# Patient Record
Sex: Female | Born: 1935 | Race: Black or African American | Hispanic: No | Smoking: Never smoker
Health system: Southern US, Community
[De-identification: ages and names within clinical notes are randomized; demographics above are authoritative.]

## PROBLEM LIST (undated history)

## (undated) DIAGNOSIS — I10 Essential (primary) hypertension: Secondary | ICD-10-CM

## (undated) DIAGNOSIS — K219 Gastro-esophageal reflux disease without esophagitis: Secondary | ICD-10-CM

## (undated) DIAGNOSIS — M1990 Unspecified osteoarthritis, unspecified site: Secondary | ICD-10-CM

## (undated) DIAGNOSIS — Z950 Presence of cardiac pacemaker: Secondary | ICD-10-CM

## (undated) HISTORY — PX: ABDOMINAL HYSTERECTOMY: SHX81

---

## 2021-08-19 ENCOUNTER — Ambulatory Visit (INDEPENDENT_AMBULATORY_CARE_PROVIDER_SITE_OTHER): Payer: Medicare Other

## 2021-08-19 ENCOUNTER — Ambulatory Visit (INDEPENDENT_AMBULATORY_CARE_PROVIDER_SITE_OTHER): Payer: Medicare Other | Admitting: Orthopaedic Surgery

## 2021-08-19 DIAGNOSIS — M1611 Unilateral primary osteoarthritis, right hip: Secondary | ICD-10-CM | POA: Diagnosis not present

## 2021-08-19 DIAGNOSIS — M25551 Pain in right hip: Secondary | ICD-10-CM

## 2021-08-19 NOTE — Progress Notes (Signed)
Office Visit Note   Patient: Audrey Gomez           Date of Birth: [birth year]           MRN: [id number] Visit Date: 08/19/2021              Requested by: No referring provider defined for this encounter. PCP: No primary care provider on file.   Assessment & Plan: Visit Diagnoses:  1. Pain in right hip   2. Unilateral primary osteoarthritis, right hip     Plan: I spoke in length in detail about hip replacement surgery.  I went over her x-rays and gave her handout about hip replacement surgery.  I showed her hip model and described in detail what the surgery involves.  We talked about the risk and benefits of surgery and what to expect from an intraoperative and postoperative course.  All questions and concerns were answered and addressed.  Due to the detrimental effect this is having on her quality of life I agree with proceeding with the surgery.  We will work on getting this scheduled.  We will be in touch.  Follow-Up Instructions: Return for 2 weeks post-op.   Orders:  Orders Placed This Encounter  Procedures   XR HIP UNILAT W OR W/O PELVIS 1V RIGHT   No orders of the defined types were placed in this encounter.     Procedures: No procedures performed   Clinical Data: No additional findings.   Subjective: Chief Complaint  Patient presents with   Right Hip - Pain  The patient is an 87 year old female who comes down from [place] to evaluate and treat known severe end-stage arthritis of her right hip.  She is already seen her cardiologist who has cleared her for surgery.  She is not on blood thinning medication.  I believe she does have a pacemaker though.  Notes in care everywhere show that she has been cleared for surgery.  Her daughter is with her.  She reports severe 10 out of 10 right hip pain has been getting worse for over a year now.  She does ambulate with a cane.  She has pain in her groin and it radiates down to her right knee.  She is not a  diabetic either.  She has tried and failed all forms conservative treatment including activity modification and anti-inflammatories and pain medications.  HPI  Review of Systems There is no listed headache, chest pain, shortness of breath, fever, chills, nausea, vomiting  Objective: Vital Signs: There were no vitals taken for this visit.  Physical Exam She is alert and orient x3 and in no acute distress Ortho Exam Examination of her right hip shows severe pain with internal and external rotation and stiffness with rotation.  Her left hip exam is normal. Specialty Comments:  No specialty comments available.  Imaging: XR HIP UNILAT W OR W/O PELVIS 1V RIGHT  Result Date: 08/19/2021 An AP pelvis and lateral of the right hip shows severe end-stage arthritis of the right hip.  There is essentially no joint space remaining.  There is flattening of the femoral head and cystic changes in the femoral head and acetabulum with periarticular osteophytes.    PMFS History: There are no problems to display for this patient.  No past medical history on file.  No family history on file.   Social History   Occupational History   Not on file  Tobacco Use   Smoking status: Not on file  Smokeless tobacco: Not on file  Substance and Sexual Activity   Alcohol use: Not on file   Drug use: Not on file   Sexual activity: Not on file

## 2021-09-26 ENCOUNTER — Telehealth: Payer: Self-pay | Admitting: Orthopaedic Surgery

## 2021-09-26 NOTE — Telephone Encounter (Signed)
Pt calling before surg to verify she does not need a 2 week pre op appt. She heard somewhere along the lines she needed a two week pre op appt as well and if she did not attend that, she would not be able to have surg on 11/29. She is currently sch'd for surg on 11/29 with 2 week post op follow up on 12/12. The best call back numbers are [phone] or [phone].

## 2021-10-01 NOTE — Telephone Encounter (Signed)
I called patient and confirmed only needs one pre-op before surgery.  It is scheduled for 10-08-21.

## 2021-10-02 ENCOUNTER — Other Ambulatory Visit: Payer: Self-pay | Admitting: Physician Assistant

## 2021-10-02 DIAGNOSIS — M1611 Unilateral primary osteoarthritis, right hip: Secondary | ICD-10-CM

## 2021-10-07 NOTE — Progress Notes (Signed)
PERIOPERATIVE PRESCRIPTION FOR IMPLANTED CARDIAC DEVICE PROGRAMMING paper was faxed to [facility] on 10/07/2021.

## 2021-10-07 NOTE — Progress Notes (Signed)
Surgical Instructions    Your procedure is scheduled on Tuesday, November 29th, 2022.   Report to [facility] Main Entrance "A" at 08:00 A.M., then check in with the Admitting office.  Call this number if you have problems the morning of surgery:  [phone]   If you have any questions prior to your surgery date call [phone]: Open Monday-Friday 8am-4pm    Remember:  Do not eat after midnight the night before your surgery  You may drink clear liquids until 07:00 the morning of your surgery.   Clear liquids allowed are: Water, Non-Citrus Juices (without pulp), Carbonated Beverages, Clear Tea, Black Coffee ONLY (NO MILK, CREAM OR POWDERED CREAMER of any kind), and Gatorade  Patient Instructions  The night before surgery:  No food after midnight. ONLY clear liquids after midnight  The day of surgery (if you do NOT have diabetes):   Drink ONE (1) Pre-Surgery Clear Ensure by 07:00 the morning of surgery. Drink in one sitting. Do not sip.  This drink was given to you during your hospital  pre-op appointment visit.  Nothing else to drink after completing the  Pre-Surgery Clear Ensure.          If you have questions, please contact your surgeon's office.     Take these medicines the morning of surgery with A SIP OF WATER:  BYSTOLIC  gabapentin (NEURONTIN)  traMADol (ULTRAM)   Follow your surgeon's instructions on when to stop Aspirin.  If no instructions were given by your surgeon then you will need to call the office to get those instructions.     As of today, STOP taking any Aspirin (unless otherwise instructed by your surgeon) Aleve, Naproxen, Ibuprofen, Motrin, Advil, Goody's, BC's, all herbal medications, fish oil, and all vitamins.   After your COVID test   You are not required to quarantine however you are required to wear a well-fitting mask when you are out and around people not in your household.  If your mask becomes wet or soiled, replace with a new  one.  Wash your hands often with soap and water for 20 seconds or clean your hands with an alcohol-based hand sanitizer that contains at least 60% alcohol.  Do not share personal items.  Notify your provider: if you are in close contact with someone who has COVID  or if you develop a fever of 100.4 or greater, sneezing, cough, sore throat, shortness of breath or body aches.    The day of surgery:           Do not wear jewelry or makeup Do not wear lotions, powders, perfumes, or deodorant. Do not shave 48 hours prior to surgery.   Do not bring valuables to the hospital. DO Not wear nail polish, gel polish, artificial nails, or any other type of covering on natural nails including finger and toenails. If patients have artificial nails, gel coating, etc. that need to be removed by a nail salon, please have this removed prior to surgery or surgery may need to be canceled/delayed if the surgeon/ anesthesia feels like the patient is unable to be adequately monitored.              [facility] is not responsible for any belongings or valuables.  Do NOT Smoke (Tobacco/Vaping)  24 hours prior to your procedure  If you use a CPAP at night, you may bring your mask for your overnight stay.   Contacts, glasses, hearing aids, dentures or partials may not be worn  into surgery, please bring cases for these belongings   For patients admitted to the hospital, discharge time will be determined by your treatment team.   Patients discharged the day of surgery will not be allowed to drive home, and someone needs to stay with them for 24 hours.  NO VISITORS WILL BE ALLOWED IN PRE-OP WHERE PATIENTS ARE PREPPED FOR SURGERY.  ONLY 1 SUPPORT PERSON MAY BE PRESENT IN THE WAITING ROOM WHILE YOU ARE IN SURGERY.  IF YOU ARE TO BE ADMITTED, ONCE YOU ARE IN YOUR ROOM YOU WILL BE ALLOWED TWO (2) VISITORS. 1 (ONE) VISITOR MAY STAY OVERNIGHT BUT MUST ARRIVE TO THE ROOM BY 8pm.  Minor children may have two parents  present. Special consideration for safety and communication needs will be reviewed on a case by case basis.  Special instructions:    Oral Hygiene is also important to reduce your risk of infection.  Remember - BRUSH YOUR TEETH THE MORNING OF SURGERY WITH YOUR REGULAR TOOTHPASTE   [facility]- Preparing For Surgery  Before surgery, you can play an important role. Because skin is not sterile, your skin needs to be as free of germs as possible. You can reduce the number of germs on your skin by washing with CHG (chlorahexidine gluconate) Soap before surgery.  CHG is an antiseptic cleaner which kills germs and bonds with the skin to continue killing germs even after washing.     Please do not use if you have an allergy to CHG or antibacterial soaps. If your skin becomes reddened/irritated stop using the CHG.  Do not shave (including legs and underarms) for at least 48 hours prior to first CHG shower. It is OK to shave your face.  Please follow these instructions carefully.     Shower the NIGHT BEFORE SURGERY and the MORNING OF SURGERY with CHG Soap.   If you chose to wash your hair, wash your hair first as usual with your normal shampoo. After you shampoo, rinse your hair and body thoroughly to remove the shampoo.  Then [employer] and genitals (private parts) with your normal soap and rinse thoroughly to remove soap.  After that Use CHG Soap as you would any other liquid soap. You can apply CHG directly to the skin and wash gently with a scrungie or a clean washcloth.   Apply the CHG Soap to your body ONLY FROM THE NECK DOWN.  Do not use on open wounds or open sores. Avoid contact with your eyes, ears, mouth and genitals (private parts). Wash Face and genitals (private parts)  with your normal soap.   Wash thoroughly, paying special attention to the area where your surgery will be performed.  Thoroughly rinse your body with warm water from the neck down.  DO NOT shower/wash with your normal  soap after using and rinsing off the CHG Soap.  Pat yourself dry with a CLEAN TOWEL.  Wear CLEAN PAJAMAS to bed the night before surgery  Place CLEAN SHEETS on your bed the night before your surgery  DO NOT SLEEP WITH PETS.   Day of Surgery:  Take a shower with CHG soap. Wear Clean/Comfortable clothing the morning of surgery Do not apply any deodorants/lotions.   Remember to brush your teeth WITH YOUR REGULAR TOOTHPASTE.   Please read over the following fact sheets that you were given.

## 2021-10-08 ENCOUNTER — Other Ambulatory Visit: Payer: Self-pay

## 2021-10-08 ENCOUNTER — Encounter (HOSPITAL_COMMUNITY): Payer: Self-pay

## 2021-10-08 ENCOUNTER — Encounter (HOSPITAL_COMMUNITY)
Admission: RE | Admit: 2021-10-08 | Discharge: 2021-10-08 | Disposition: A | Discharge status: Home / Self Care | Payer: Medicare Other | Source: Ambulatory Visit | Attending: Orthopaedic Surgery | Admitting: Orthopaedic Surgery

## 2021-10-08 VITALS — BP 131/82 | HR 63 | Temp 98.4°F | Resp 18 | Ht 71.0 in

## 2021-10-08 DIAGNOSIS — I272 Pulmonary hypertension, unspecified: Secondary | ICD-10-CM | POA: Insufficient documentation

## 2021-10-08 DIAGNOSIS — I071 Rheumatic tricuspid insufficiency: Secondary | ICD-10-CM | POA: Diagnosis not present

## 2021-10-08 DIAGNOSIS — Z01818 Encounter for other preprocedural examination: Secondary | ICD-10-CM | POA: Insufficient documentation

## 2021-10-08 DIAGNOSIS — M1611 Unilateral primary osteoarthritis, right hip: Secondary | ICD-10-CM | POA: Insufficient documentation

## 2021-10-08 DIAGNOSIS — I10 Essential (primary) hypertension: Secondary | ICD-10-CM | POA: Diagnosis not present

## 2021-10-08 DIAGNOSIS — I2510 Atherosclerotic heart disease of native coronary artery without angina pectoris: Secondary | ICD-10-CM | POA: Insufficient documentation

## 2021-10-08 DIAGNOSIS — E785 Hyperlipidemia, unspecified: Secondary | ICD-10-CM | POA: Insufficient documentation

## 2021-10-08 HISTORY — DX: Presence of cardiac pacemaker: Z95.0

## 2021-10-08 HISTORY — DX: Essential (primary) hypertension: I10

## 2021-10-08 HISTORY — DX: Gastro-esophageal reflux disease without esophagitis: K21.9

## 2021-10-08 HISTORY — DX: Unspecified osteoarthritis, unspecified site: M19.90

## 2021-10-08 LAB — BASIC METABOLIC PANEL
Anion gap: 5 (ref 5–15)
BUN: 13 mg/dL (ref 8–23)
CO2: 26 mmol/L (ref 22–32)
Calcium: 9 mg/dL (ref 8.9–10.3)
Chloride: 104 mmol/L (ref 98–111)
Creatinine, Ser: 0.68 mg/dL (ref 0.44–1.00)
GFR, Estimated: >60 mL/min (ref >60)
Glucose, Bld: 82 mg/dL (ref 70–99)
Potassium: 4.6 mmol/L (ref 3.5–5.1)
Sodium: 135 mmol/L (ref 135–145)

## 2021-10-08 LAB — CBC
HCT: 44.5 % (ref 36.0–46.0)
Hemoglobin: 14 g/dL (ref 12.0–15.0)
MCH: 30 pg (ref 26.0–34.0)
MCHC: 31.5 g/dL (ref 30.0–36.0)
MCV: 95.5 fL (ref 80.0–100.0)
Platelets: 217 10*3/uL (ref 150–400)
RBC: 4.66 MIL/uL (ref 3.87–5.11)
RDW: 14 % (ref 11.5–15.5)
WBC: 7.6 10*3/uL (ref 4.0–10.5)
nRBC: 0 % (ref 0.0–0.2)

## 2021-10-08 LAB — SURGICAL PCR SCREEN
MRSA, PCR: POSITIVE — AB
Staphylococcus aureus: POSITIVE — AB

## 2021-10-08 LAB — TYPE AND SCREEN
ABO/RH(D): O POS
Antibody Screen: NEGATIVE

## 2021-10-08 NOTE — Progress Notes (Signed)
PCP: Tamala Fothergill, MD Cardiologist: Derry Lory, MD  EKG: 10/08/21 CXR: 04/10/21 ECHO: 12/21/17 Stress Test: 04/24/14 Cardiac Cath: 02/18/21  Fasting Blood Sugar-na Checks Blood Sugar__na_ times a day  ASA: Patient to call office for instructions Blood Thinner: No  OSA/CPAP: No  Covid test to be done DOS.  Patient lives 1.5 hours away in [place]  Anesthesia Review: Yes, cardiac history.  Reviewed with Fayrene Fearing, [place].  Do not need to request any records.  Clearance in note per Fayrene Fearing.  PPM [employer].  Fax and email sent.  Patient denies shortness of breath, fever, cough, and chest pain at PAT appointment.  Patient verbalized understanding of instructions provided today at the PAT appointment.  Patient asked to review instructions at home and day of surgery.

## 2021-10-08 NOTE — Progress Notes (Signed)
Received PCR result from A. Browning in [place]. Pt positive for MRSA and Staph. Pt will be notified and treated day of surgery.

## 2021-10-08 NOTE — Progress Notes (Incomplete Revision)
PCP: Tamala Fothergill, MD Cardiologist: Derry Lory, MD  EKG: 10/08/21 CXR: 04/10/21 ECHO: 12/21/17 Stress Test: 04/24/14 Cardiac Cath: 02/18/21  Fasting Blood Sugar-na Checks Blood Sugar__na_ times a day  ASA: Patient to call office for instructions Blood Thinner: No  OSA/CPAP: No  Covid test to be done DOS.  Patient lives 1.5 hours away in [place]  Anesthesia Review: Yes, cardiac history.  Reviewed with Fayrene Fearing, [place].  Do not need to request any records.  Clearance in note per Fayrene Fearing.  PPM [employer].  Fax sent and spoke with Kerry Fort, [employer] rep.  He is aware of surgery.  Patient denies shortness of breath, fever, cough, and chest pain at PAT appointment.  Patient verbalized understanding of instructions provided today at the PAT appointment.  Patient asked to review instructions at home and day of surgery.

## 2021-10-09 NOTE — Progress Notes (Addendum)
Anesthesia Chart Review:  Follows with cardiologist Dr. Robby Sermon at [facility] for hx of HTN, HLD, mild TR, mild pulm HTN, nonobstructive CAD, sinus node dysfunction s/p St[employer] Jude PPM. Last seen 08/01/21 for preop eval. Per note, "From the C-V standpoint, based on the [facility] guidelines, the patient is considered to be at low to intermediate risk and it is OK to proceed with her surgery as scheduled. Please avoid hypoxia and hypotension during her procedure."  Perioperative cardiac device instruction form has been faxed to [facility] by PAT RN.  Preop labs reviewed, unremarkable.  EKG 10/08/21: Atrial pace rhythm. Rate 62.  Chest 2 View 04/10/21 (care everywhere):  Comparison is to 03/30/2021. There is a chronically elevated right hemidiaphragm. The cardiac size, mediastinum and pulmonary vascularity are within normal limits. There is a tortuous aorta. Dual-lead pacemaker is unchanged. No obvious rib fracture or pneumothorax. There are marked degenerative changes in both shoulder joints with joint space narrowing.     Cath 02/18/21 (care everywhere): IMPRESSION:  1. Coronaries: 10 to 40% coronary artery disease as noted above.  2. Left ventricle: No wall motion abnormalities, EF 60%  3. Normal LVEDP  4. No gradient across aortic valve   TTE 11/15/21 (care everywhere): CONCLUSIONS    * Left ventricular systolic function is hyperdynamic with an ejection  fraction of 70 % by Simpson's biplane.    * Left ventricular chamber size is normal.    * Left ventricular segmental wall motion is normal.    * There is mildly increased left ventricular wall thickness.    * Left ventricular diastolic function: Grade I diastolic dysfunction.    * Right ventricular chamber dimension is mildly enlarged.    * Right ventricular systolic function is normal with TAPSE measuring 2.62  cm.    * Left atrial chamber is mildly enlarged with a left atrial volume index of  34.70 ml/m^2 by BP MOD.    * Trileaflet aortic valve is  structurally and functionally normal by  two-dimensional, color flow Doppler and Doppler interrogation.    * There is trace mitral valve regurgitation.    * There is mild tricuspid valve regurgitation.    * Moderate pulmonary hypertension, estimated pulmonary arterial systolic  pressure is 54 mmHg.    * There is trace pulmonic regurgitation.    * Device lead was seen in the right atrium and right ventricle.     Zannie Cove [facility] Short Stay Center/Anesthesiology Phone [phone] 10/09/2021 10:55 AM

## 2021-10-09 NOTE — Anesthesia Preprocedure Evaluation (Addendum)
Anesthesia Evaluation  Patient identified by MRN, date of birth, ID band Patient awake    Reviewed: Allergy & Precautions, NPO status , Patient's Chart, lab work & pertinent test results, reviewed documented beta blocker date and time   History of Anesthesia Complications Negative for: history of anesthetic complications  Airway Mallampati: II  TM Distance: >3 FB Neck ROM: Full    Dental  (+) Edentulous Upper, Missing, Dental Advisory Given   Pulmonary neg pulmonary ROS,    Pulmonary exam normal        Cardiovascular hypertension, Pt. on home beta blockers Normal cardiovascular exam+ pacemaker      Neuro/Psych negative neurological ROS     GI/Hepatic Neg liver ROS, GERD  ,  Endo/Other  negative endocrine ROS  Renal/GU negative Renal ROS     Musculoskeletal negative musculoskeletal ROS (+)   Abdominal   Peds  Hematology negative hematology ROS (+)   Anesthesia Other Findings   Reproductive/Obstetrics                           Anesthesia Physical Anesthesia Plan  ASA: 3  Anesthesia Plan: Spinal   Post-op Pain Management: Tylenol PO (pre-op)   Induction:   PONV Risk Score and Plan: 2  Airway Management Planned: Natural Airway  Additional Equipment:   Intra-op Plan:   Post-operative Plan:   Informed Consent: I have reviewed the patients History and Physical, chart, labs and discussed the procedure including the risks, benefits and alternatives for the proposed anesthesia with the patient or authorized representative who has indicated his/her understanding and acceptance.     Dental advisory given  Plan Discussed with: Anesthesiologist and CRNA  Anesthesia Plan Comments: (PAT note by Karoline Caldwell, PA-C: Follows with cardiologist Dr. Mayer Masker at [facility] for hx of HTN, HLD, mild TR, mild pulm HTN, nonobstructive CAD, sinus node dysfunction s/p [employer] Jude PPM. Last seen 08/01/21  for preop eval. Per note, "From the C-V standpoint, based on the [facility] guidelines, the patient is considered to be at low to intermediate risk and it is OK to proceed with her surgery as scheduled. Please avoid hypoxia and hypotension during her procedure."  Perioperative cardiac device instruction form has been faxed to [facility] by PAT RN.  Preop labs reviewed, unremarkable.  EKG 10/08/21: Atrial pace rhythm. Rate 62.  Chest 2 View 04/10/21 (care everywhere): Comparison is to 03/30/2021. There is a chronically elevated right hemidiaphragm. The cardiac size, mediastinum and pulmonary vascularity are within normal limits. There is a tortuous aorta. Dual-lead pacemaker is unchanged. No obvious rib fracture or pneumothorax. There are marked degenerative changes in both shoulder joints with joint space narrowing.    Cath 02/18/21 (care everywhere): IMPRESSION:  1. Coronaries: 10to 40% coronary artery disease as noted above.  2. Left ventricle: No wall motion abnormalities, EF 60%  3. Normal LVEDP  4. No gradient across aortic valve   TTE 11/15/21 (care everywhere): CONCLUSIONS  * Left ventricular systolic function is hyperdynamic with an ejection  fraction of 70 % by Simpson's biplane.  * Left ventricular chamber size is normal.  * Left ventricular segmental wall motion is normal.  * There is mildly increased left ventricular wall thickness.  * Left ventricular diastolic function: Grade I diastolic dysfunction.  * Right ventricular chamber dimension is mildly enlarged.  * Right ventricular systolic function is normal with TAPSE measuring 2.62  cm.  * Left atrial chamber is mildly enlarged with a left atrial volume  index of  34.70 ml/m^2 by BP MOD.  * Trileaflet aortic valve is structurally and functionally normal by  two-dimensional, color flow Doppler and Doppler interrogation.  * There is trace mitral valve regurgitation.  * There is mild tricuspid valve  regurgitation.  * Moderate pulmonary hypertension, estimated pulmonary arterial systolic  pressure is 54 mmHg.  * There is trace pulmonic regurgitation.  * Device lead was seen in the right atrium and right ventricle.   )      Anesthesia Quick Evaluation

## 2021-10-15 ENCOUNTER — Other Ambulatory Visit: Payer: Self-pay

## 2021-10-15 ENCOUNTER — Observation Stay (HOSPITAL_COMMUNITY): Payer: Medicare Other

## 2021-10-15 ENCOUNTER — Ambulatory Visit (HOSPITAL_COMMUNITY): Payer: Medicare Other

## 2021-10-15 ENCOUNTER — Ambulatory Visit (HOSPITAL_COMMUNITY): Payer: Medicare Other | Admitting: Anesthesiology

## 2021-10-15 ENCOUNTER — Inpatient Hospital Stay (HOSPITAL_COMMUNITY)
Admission: RE | Admit: 2021-10-15 | Discharge: 2021-10-18 | DRG: 470 | Disposition: A | Discharge status: Skilled Nursing Facility | Payer: Medicare Other | Attending: Orthopaedic Surgery | Admitting: Orthopaedic Surgery

## 2021-10-15 ENCOUNTER — Encounter (HOSPITAL_COMMUNITY)
Admission: RE | Disposition: A | Discharge status: Skilled Nursing Facility | Payer: Self-pay | Source: Home / Self Care | Attending: Orthopaedic Surgery

## 2021-10-15 ENCOUNTER — Encounter (HOSPITAL_COMMUNITY): Payer: Self-pay | Admitting: Orthopaedic Surgery

## 2021-10-15 ENCOUNTER — Ambulatory Visit (HOSPITAL_COMMUNITY): Payer: Medicare Other | Admitting: Physician Assistant

## 2021-10-15 DIAGNOSIS — Z96641 Presence of right artificial hip joint: Secondary | ICD-10-CM

## 2021-10-15 DIAGNOSIS — Z888 Allergy status to other drugs, medicaments and biological substances status: Secondary | ICD-10-CM

## 2021-10-15 DIAGNOSIS — Z419 Encounter for procedure for purposes other than remedying health state, unspecified: Secondary | ICD-10-CM

## 2021-10-15 DIAGNOSIS — K219 Gastro-esophageal reflux disease without esophagitis: Secondary | ICD-10-CM | POA: Diagnosis present

## 2021-10-15 DIAGNOSIS — Z20822 Contact with and (suspected) exposure to covid-19: Secondary | ICD-10-CM | POA: Diagnosis present

## 2021-10-15 DIAGNOSIS — D62 Acute posthemorrhagic anemia: Secondary | ICD-10-CM | POA: Diagnosis not present

## 2021-10-15 DIAGNOSIS — Z79899 Other long term (current) drug therapy: Secondary | ICD-10-CM

## 2021-10-15 DIAGNOSIS — Z950 Presence of cardiac pacemaker: Secondary | ICD-10-CM

## 2021-10-15 DIAGNOSIS — M24569 Contracture, unspecified knee: Secondary | ICD-10-CM | POA: Diagnosis present

## 2021-10-15 DIAGNOSIS — I959 Hypotension, unspecified: Secondary | ICD-10-CM | POA: Diagnosis not present

## 2021-10-15 DIAGNOSIS — M1611 Unilateral primary osteoarthritis, right hip: Principal | ICD-10-CM

## 2021-10-15 DIAGNOSIS — Z90710 Acquired absence of both cervix and uterus: Secondary | ICD-10-CM

## 2021-10-15 DIAGNOSIS — I10 Essential (primary) hypertension: Secondary | ICD-10-CM | POA: Diagnosis present

## 2021-10-15 DIAGNOSIS — I878 Other specified disorders of veins: Secondary | ICD-10-CM | POA: Diagnosis present

## 2021-10-15 HISTORY — PX: TOTAL HIP ARTHROPLASTY: SHX124

## 2021-10-15 LAB — ABO/RH: ABO/RH(D): O POS

## 2021-10-15 LAB — SARS CORONAVIRUS 2 BY RT PCR (HOSPITAL ORDER, PERFORMED IN ~~LOC~~ HOSPITAL LAB): SARS Coronavirus 2: NEGATIVE

## 2021-10-15 SURGERY — ARTHROPLASTY, HIP, TOTAL, ANTERIOR APPROACH
Anesthesia: Spinal | Site: Hip | Laterality: Right

## 2021-10-15 MED ORDER — VANCOMYCIN HCL 1000 MG/200ML IV SOLN
1000.0000 mg | Freq: Two times a day (BID) | INTRAVENOUS | Status: AC
  Administered 2021-10-15: 1000 mg via INTRAVENOUS
  Filled 2021-10-15: qty 200

## 2021-10-15 MED ORDER — METHOCARBAMOL 500 MG PO TABS
500.0000 mg | ORAL_TABLET | Freq: Four times a day (QID) | ORAL | Status: DC | PRN
  Administered 2021-10-15 – 2021-10-16 (×3): 500 mg via ORAL
  Filled 2021-10-15: qty 1

## 2021-10-15 MED ORDER — ONDANSETRON HCL 4 MG PO TABS: 4.0000 mg | ORAL_TABLET | Freq: Four times a day (QID) | ORAL | Status: DC | PRN

## 2021-10-15 MED ORDER — TRANEXAMIC ACID-NACL 1000-0.7 MG/100ML-% IV SOLN
INTRAVENOUS | Status: AC
  Filled 2021-10-15: qty 100

## 2021-10-15 MED ORDER — DOCUSATE SODIUM 100 MG PO CAPS
100.0000 mg | ORAL_CAPSULE | Freq: Two times a day (BID) | ORAL | Status: DC
  Administered 2021-10-15 – 2021-10-18 (×7): 100 mg via ORAL
  Filled 2021-10-15 (×5): qty 1

## 2021-10-15 MED ORDER — PHENOL 1.4 % MT LIQD: 1.0000 | OROMUCOSAL | Status: DC | PRN

## 2021-10-15 MED ORDER — ALBUMIN HUMAN 5 % IV SOLN
INTRAVENOUS | Status: AC
  Filled 2021-10-15: qty 250

## 2021-10-15 MED ORDER — HYDROCODONE-ACETAMINOPHEN 7.5-325 MG PO TABS
ORAL_TABLET | ORAL | Status: AC
  Filled 2021-10-15: qty 2

## 2021-10-15 MED ORDER — FENTANYL CITRATE (PF) 100 MCG/2ML IJ SOLN
25.0000 ug | INTRAMUSCULAR | Status: DC | PRN
  Administered 2021-10-15: 25 ug via INTRAVENOUS

## 2021-10-15 MED ORDER — ORAL CARE MOUTH RINSE: 15.0000 mL | Freq: Once | OROMUCOSAL | Status: AC

## 2021-10-15 MED ORDER — DOCUSATE SODIUM 100 MG PO CAPS
ORAL_CAPSULE | ORAL | Status: AC
  Administered 2021-10-15: 100 mg
  Filled 2021-10-15: qty 1

## 2021-10-15 MED ORDER — NEBIVOLOL HCL 10 MG PO TABS
10.0000 mg | ORAL_TABLET | Freq: Every day | ORAL | Status: DC
  Administered 2021-10-16 – 2021-10-17 (×2): 10 mg via ORAL
  Filled 2021-10-15 (×4): qty 1

## 2021-10-15 MED ORDER — PANTOPRAZOLE SODIUM 40 MG PO TBEC
40.0000 mg | DELAYED_RELEASE_TABLET | Freq: Every day | ORAL | Status: DC
  Administered 2021-10-15 – 2021-10-18 (×4): 40 mg via ORAL
  Filled 2021-10-15 (×3): qty 1

## 2021-10-15 MED ORDER — ONDANSETRON HCL 4 MG/2ML IJ SOLN
INTRAMUSCULAR | Status: DC | PRN
  Administered 2021-10-15: 4 mg via INTRAVENOUS

## 2021-10-15 MED ORDER — CHLORHEXIDINE GLUCONATE 0.12 % MT SOLN
OROMUCOSAL | Status: AC
  Filled 2021-10-15: qty 15

## 2021-10-15 MED ORDER — METOCLOPRAMIDE HCL 5 MG/ML IJ SOLN: 5.0000 mg | Freq: Three times a day (TID) | INTRAMUSCULAR | Status: DC | PRN

## 2021-10-15 MED ORDER — ACETAMINOPHEN 325 MG PO TABS: 325.0000 mg | ORAL_TABLET | Freq: Four times a day (QID) | ORAL | Status: DC | PRN

## 2021-10-15 MED ORDER — TRANEXAMIC ACID-NACL 1000-0.7 MG/100ML-% IV SOLN
1000.0000 mg | INTRAVENOUS | Status: AC
  Administered 2021-10-15: 1000 mg via INTRAVENOUS

## 2021-10-15 MED ORDER — PLECANATIDE 3 MG PO TABS: 3.0000 mg | ORAL_TABLET | Freq: Every day | ORAL | Status: DC

## 2021-10-15 MED ORDER — TRAMADOL HCL 50 MG PO TABS
50.0000 mg | ORAL_TABLET | Freq: Four times a day (QID) | ORAL | Status: DC
  Administered 2021-10-16 – 2021-10-18 (×8): 50 mg via ORAL
  Filled 2021-10-15 (×10): qty 1

## 2021-10-15 MED ORDER — POVIDONE-IODINE 10 % EX SWAB
2.0000 "application " | Freq: Once | CUTANEOUS | Status: AC
  Administered 2021-10-15: 2 via TOPICAL

## 2021-10-15 MED ORDER — PROPOFOL 10 MG/ML IV BOLUS
INTRAVENOUS | Status: DC | PRN
  Administered 2021-10-15 (×3): 20 mg via INTRAVENOUS

## 2021-10-15 MED ORDER — ALUM & MAG HYDROXIDE-SIMETH 200-200-20 MG/5ML PO SUSP: 30.0000 mL | ORAL | Status: DC | PRN

## 2021-10-15 MED ORDER — MORPHINE SULFATE (PF) 2 MG/ML IV SOLN
0.5000 mg | INTRAVENOUS | Status: DC | PRN
  Administered 2021-10-15: 1 mg via INTRAVENOUS

## 2021-10-15 MED ORDER — BUPIVACAINE IN DEXTROSE 0.75-8.25 % IT SOLN
INTRATHECAL | Status: DC | PRN
  Administered 2021-10-15: 1.8 mL via INTRATHECAL

## 2021-10-15 MED ORDER — METHOCARBAMOL 1000 MG/10ML IJ SOLN
500.0000 mg | Freq: Four times a day (QID) | INTRAVENOUS | Status: DC | PRN
  Filled 2021-10-15: qty 5

## 2021-10-15 MED ORDER — LACTATED RINGERS IV SOLN: INTRAVENOUS | Status: DC

## 2021-10-15 MED ORDER — GABAPENTIN 300 MG PO CAPS
ORAL_CAPSULE | ORAL | Status: AC
  Administered 2021-10-15: 600 mg
  Filled 2021-10-15: qty 2

## 2021-10-15 MED ORDER — ONDANSETRON HCL 4 MG/2ML IJ SOLN: 4.0000 mg | Freq: Four times a day (QID) | INTRAMUSCULAR | Status: DC | PRN

## 2021-10-15 MED ORDER — 0.9 % SODIUM CHLORIDE (POUR BTL) OPTIME
TOPICAL | Status: DC | PRN
  Administered 2021-10-15: 1000 mL

## 2021-10-15 MED ORDER — GABAPENTIN 600 MG PO TABS
600.0000 mg | ORAL_TABLET | Freq: Three times a day (TID) | ORAL | Status: DC
  Administered 2021-10-15 – 2021-10-18 (×10): 600 mg via ORAL
  Filled 2021-10-15 (×8): qty 1

## 2021-10-15 MED ORDER — CEFAZOLIN SODIUM-DEXTROSE 2-4 GM/100ML-% IV SOLN: 2.0000 g | INTRAVENOUS | Status: DC

## 2021-10-15 MED ORDER — SODIUM CHLORIDE 0.9 % IR SOLN
Status: DC | PRN
  Administered 2021-10-15: 1000 mL

## 2021-10-15 MED ORDER — HYDROCODONE-ACETAMINOPHEN 7.5-325 MG PO TABS
1.0000 | ORAL_TABLET | ORAL | Status: DC | PRN
  Administered 2021-10-15: 2 via ORAL
  Administered 2021-10-15: 1 via ORAL
  Administered 2021-10-16 – 2021-10-18 (×4): 2 via ORAL
  Filled 2021-10-15 (×4): qty 2

## 2021-10-15 MED ORDER — PROPOFOL 500 MG/50ML IV EMUL
INTRAVENOUS | Status: DC | PRN
Start: 2021-10-15 — End: 2021-10-15
  Administered 2021-10-15: 75 ug/kg/min via INTRAVENOUS

## 2021-10-15 MED ORDER — BISACODYL 10 MG RE SUPP: 10.0000 mg | Freq: Every day | RECTAL | Status: DC | PRN

## 2021-10-15 MED ORDER — METOCLOPRAMIDE HCL 5 MG PO TABS: 5.0000 mg | ORAL_TABLET | Freq: Three times a day (TID) | ORAL | Status: DC | PRN

## 2021-10-15 MED ORDER — ASPIRIN 81 MG PO CHEW
81.0000 mg | CHEWABLE_TABLET | Freq: Two times a day (BID) | ORAL | Status: DC
  Administered 2021-10-15 – 2021-10-18 (×6): 81 mg via ORAL
  Filled 2021-10-15 (×5): qty 1

## 2021-10-15 MED ORDER — POLYETHYLENE GLYCOL 3350 17 G PO PACK: 17.0000 g | PACK | Freq: Every day | ORAL | Status: DC | PRN

## 2021-10-15 MED ORDER — METHOCARBAMOL 500 MG PO TABS
ORAL_TABLET | ORAL | Status: AC
  Filled 2021-10-15: qty 1

## 2021-10-15 MED ORDER — ASPIRIN 81 MG PO CHEW
CHEWABLE_TABLET | ORAL | Status: AC
  Administered 2021-10-15: 81 mg
  Filled 2021-10-15: qty 1

## 2021-10-15 MED ORDER — VANCOMYCIN HCL IN DEXTROSE 1-5 GM/200ML-% IV SOLN
1000.0000 mg | INTRAVENOUS | Status: AC
  Administered 2021-10-15: 1000 mg via INTRAVENOUS

## 2021-10-15 MED ORDER — DIPHENHYDRAMINE HCL 12.5 MG/5ML PO ELIX: 12.5000 mg | ORAL_SOLUTION | ORAL | Status: DC | PRN

## 2021-10-15 MED ORDER — CHLORHEXIDINE GLUCONATE 0.12 % MT SOLN
15.0000 mL | Freq: Once | OROMUCOSAL | Status: AC
  Administered 2021-10-15: 15 mL via OROMUCOSAL

## 2021-10-15 MED ORDER — FENTANYL CITRATE (PF) 100 MCG/2ML IJ SOLN
INTRAMUSCULAR | Status: AC
  Filled 2021-10-15: qty 2

## 2021-10-15 MED ORDER — PHENYLEPHRINE HCL-NACL 20-0.9 MG/250ML-% IV SOLN
INTRAVENOUS | Status: DC | PRN
  Administered 2021-10-15: 30 ug/min via INTRAVENOUS

## 2021-10-15 MED ORDER — SODIUM CHLORIDE 0.9 % IV SOLN: INTRAVENOUS | Status: DC

## 2021-10-15 MED ORDER — HYDROCODONE-ACETAMINOPHEN 5-325 MG PO TABS
1.0000 | ORAL_TABLET | ORAL | Status: DC | PRN
  Administered 2021-10-16: 2 via ORAL
  Filled 2021-10-15: qty 2

## 2021-10-15 MED ORDER — MENTHOL 3 MG MT LOZG: 1.0000 | LOZENGE | OROMUCOSAL | Status: DC | PRN

## 2021-10-15 MED ORDER — VITAMIN D3 25 MCG (1000 UNIT) PO TABS
1000.0000 [IU] | ORAL_TABLET | ORAL | Status: DC
  Administered 2021-10-15: 1000 [IU] via ORAL
  Filled 2021-10-15: qty 1

## 2021-10-15 MED ORDER — VANCOMYCIN HCL IN DEXTROSE 1-5 GM/200ML-% IV SOLN
INTRAVENOUS | Status: AC
  Filled 2021-10-15: qty 200

## 2021-10-15 MED ORDER — EPHEDRINE 5 MG/ML INJ
INTRAVENOUS | Status: AC
  Filled 2021-10-15: qty 5

## 2021-10-15 SURGICAL SUPPLY — 54 items
ARTICULEZE HEAD (Hips) ×2 IMPLANT
BAG COUNTER SPONGE SURGICOUNT (BAG) ×2 IMPLANT
BENZOIN TINCTURE PRP APPL 2/3 (GAUZE/BANDAGES/DRESSINGS) ×2 IMPLANT
BLADE SAW SGTL 18X1.27X75 (BLADE) ×2 IMPLANT
COVER SURGICAL LIGHT HANDLE (MISCELLANEOUS) ×2 IMPLANT
DRAPE C-ARM 42X72 X-RAY (DRAPES) ×2 IMPLANT
DRAPE STERI IOBAN 125X83 (DRAPES) ×2 IMPLANT
DRAPE U-SHAPE 47X51 STRL (DRAPES) ×6 IMPLANT
DRESSING AQUACEL AG SP 3.5X10 (GAUZE/BANDAGES/DRESSINGS) ×1 IMPLANT
DRSG AQUACEL AG ADV 3.5X10 (GAUZE/BANDAGES/DRESSINGS) ×2 IMPLANT
DRSG AQUACEL AG SP 3.5X10 (GAUZE/BANDAGES/DRESSINGS) ×2
DURAPREP 26ML APPLICATOR (WOUND CARE) ×2 IMPLANT
ELECT BLADE 4.0 EZ CLEAN MEGAD (MISCELLANEOUS) ×2
ELECT REM PT RETURN 9FT ADLT (ELECTROSURGICAL) ×2
ELECTRODE BLDE 4.0 EZ CLN MEGD (MISCELLANEOUS) ×1 IMPLANT
ELECTRODE REM PT RTRN 9FT ADLT (ELECTROSURGICAL) ×1 IMPLANT
FACESHIELD WRAPAROUND (MASK) ×6 IMPLANT
GLOVE SRG 8 PF TXTR STRL LF DI (GLOVE) ×2 IMPLANT
GLOVE SURG LTX SZ8 (GLOVE) ×2 IMPLANT
GLOVE SURG ORTHO LTX SZ7.5 (GLOVE) ×4 IMPLANT
GLOVE SURG UNDER POLY LF SZ8 (GLOVE) ×2
GOWN STRL REUS W/ TWL LRG LVL3 (GOWN DISPOSABLE) ×2 IMPLANT
GOWN STRL REUS W/ TWL XL LVL3 (GOWN DISPOSABLE) ×2 IMPLANT
GOWN STRL REUS W/TWL LRG LVL3 (GOWN DISPOSABLE) ×2
GOWN STRL REUS W/TWL XL LVL3 (GOWN DISPOSABLE) ×2
HANDPIECE INTERPULSE COAX TIP (DISPOSABLE) ×1
HEAD ARTICULEZE (Hips) ×1 IMPLANT
KIT BASIN OR (CUSTOM PROCEDURE TRAY) ×2 IMPLANT
KIT TURNOVER KIT B (KITS) ×2 IMPLANT
LINER NEUTRAL 52X36MM PLUS 4 (Liner) ×2 IMPLANT
MANIFOLD NEPTUNE II (INSTRUMENTS) ×4 IMPLANT
NS IRRIG 1000ML POUR BTL (IV SOLUTION) ×2 IMPLANT
PACK TOTAL JOINT (CUSTOM PROCEDURE TRAY) ×2 IMPLANT
PAD ARMBOARD 7.5X6 YLW CONV (MISCELLANEOUS) ×2 IMPLANT
PIN SECTOR W/GRIP ACE CUP 52MM (Hips) ×2 IMPLANT
SET HNDPC FAN SPRY TIP SCT (DISPOSABLE) ×1 IMPLANT
SPONGE T-LAP 18X18 ~~LOC~~+RFID (SPONGE) ×6 IMPLANT
STAPLER VISISTAT 35W (STAPLE) ×2 IMPLANT
STEM CORAIL KA12 (Stem) ×2 IMPLANT
STRIP CLOSURE SKIN 1/2X4 (GAUZE/BANDAGES/DRESSINGS) IMPLANT
SUT ETHIBOND NAB CT1 #1 30IN (SUTURE) ×2 IMPLANT
SUT MNCRL AB 4-0 PS2 18 (SUTURE) IMPLANT
SUT VIC AB 0 CT1 27 (SUTURE) ×1
SUT VIC AB 0 CT1 27XBRD ANBCTR (SUTURE) ×1 IMPLANT
SUT VIC AB 1 CT1 27 (SUTURE) ×1
SUT VIC AB 1 CT1 27XBRD ANBCTR (SUTURE) ×1 IMPLANT
SUT VIC AB 2-0 CT1 27 (SUTURE) ×1
SUT VIC AB 2-0 CT1 TAPERPNT 27 (SUTURE) ×1 IMPLANT
TOWEL GREEN STERILE (TOWEL DISPOSABLE) ×2 IMPLANT
TOWEL GREEN STERILE FF (TOWEL DISPOSABLE) ×2 IMPLANT
TRAY CATH 16FR W/PLASTIC CATH (SET/KITS/TRAYS/PACK) IMPLANT
TRAY FOLEY W/BAG SLVR 16FR (SET/KITS/TRAYS/PACK)
TRAY FOLEY W/BAG SLVR 16FR ST (SET/KITS/TRAYS/PACK) IMPLANT
WATER STERILE IRR 1000ML POUR (IV SOLUTION) ×4 IMPLANT

## 2021-10-15 NOTE — H&P (Signed)
TOTAL HIP ADMISSION H&P  Patient is admitted for right total hip arthroplasty.  Subjective:  Chief Complaint: right hip pain  HPI: Audrey Gomez, 87 y.o. female, has a history of pain and functional disability in the right hip(s) due to arthritis and patient has failed non-surgical conservative treatments for greater than 12 weeks to include NSAID's and/or analgesics, use of assistive devices, and activity modification.  Onset of symptoms was gradual starting 1 years ago with gradually worsening course since that time.The patient noted no past surgery on the right hip(s).  Patient currently rates pain in the right hip at 10 out of 10 with activity. Patient has night pain, worsening of pain with activity and weight bearing, trendelenberg gait, pain that interfers with activities of daily living, and pain with passive range of motion. Patient has evidence of subchondral cysts, subchondral sclerosis, periarticular osteophytes, and joint space narrowing by imaging studies. This condition presents safety issues increasing the risk of falls.  There is no current active infection.  Patient Active Problem List   Diagnosis Date Noted   Unilateral primary osteoarthritis, right hip 10/15/2021   Past Medical History:  Diagnosis Date   Arthritis    GERD (gastroesophageal reflux disease)    Hypertension    Presence of permanent cardiac pacemaker     Past Surgical History:  Procedure Laterality Date   ABDOMINAL HYSTERECTOMY      Current Facility-Administered Medications  Medication Dose Route Frequency Provider Last Rate Last Admin   ceFAZolin (ANCEF) IVPB 2g/100 mL premix  2 g Intravenous On Call to OR Pete Pelt, PA-C       chlorhexidine (PERIDEX) 0.12 % solution 15 mL  15 mL Mouth/Throat Once Duane Boston, MD       Or   MEDLINE mouth rinse  15 mL Mouth Rinse Once Duane Boston, MD       lactated ringers infusion   Intravenous Continuous Duane Boston, MD       povidone-iodine 10 %  swab 2 application  2 application Topical Once Erskine Emery W, PA-C       tranexamic acid (CYKLOKAPRON) IVPB 1,000 mg  1,000 mg Intravenous To OR Erskine Emery W, PA-C       vancomycin (VANCOCIN) IVPB 1000 mg/200 mL premix  1,000 mg Intravenous On Call Karren Cobble, RPH       Allergies  Allergen Reactions   Statins Other (See Comments)    Other reaction(s): muscle/joint pain Severe pain    Tetracycline Itching and Rash    Social History   Tobacco Use   Smoking status: Never   Smokeless tobacco: Never  Substance Use Topics   Alcohol use: Not Currently    History reviewed. No pertinent family history.   Review of Systems  Musculoskeletal:  Positive for gait problem.  All other systems reviewed and are negative.  Objective:  Physical Exam Vitals reviewed.  Constitutional:      Appearance: Normal appearance.  HENT:     Head: Normocephalic and atraumatic.  Eyes:     Extraocular Movements: Extraocular movements intact.     Pupils: Pupils are equal, round, and reactive to light.  Cardiovascular:     Rate and Rhythm: Normal rate.  Pulmonary:     Effort: Pulmonary effort is normal.     Breath sounds: Normal breath sounds.  Abdominal:     Palpations: Abdomen is soft.  Musculoskeletal:     Cervical back: Normal range of motion and neck supple.  Right hip: Tenderness and bony tenderness present. Decreased range of motion. Decreased strength.  Neurological:     Mental Status: She is alert and oriented to person, place, and time.  Psychiatric:        Behavior: Behavior normal.    Vital signs in last 24 hours:    Labs:   There is no height or weight on file to calculate BMI.   Imaging Review Plain radiographs demonstrate severe degenerative joint disease of the right hip(s). The bone quality appears to be fair for age and reported activity level.      Assessment/Plan:  End stage arthritis, right hip(s)  The patient history, physical examination,  clinical judgement of the provider and imaging studies are consistent with end stage degenerative joint disease of the right hip(s) and total hip arthroplasty is deemed medically necessary. The treatment options including medical management, injection therapy, arthroscopy and arthroplasty were discussed at length. The risks and benefits of total hip arthroplasty were presented and reviewed. The risks due to aseptic loosening, infection, stiffness, dislocation/subluxation,  thromboembolic complications and other imponderables were discussed.  The patient acknowledged the explanation, agreed to proceed with the plan and consent was signed. Patient is being admitted for inpatient treatment for surgery, pain control, PT, OT, prophylactic antibiotics, VTE prophylaxis, progressive ambulation and ADL's and discharge planning.The patient is planning to be discharged home with home health services

## 2021-10-15 NOTE — OR Nursing (Signed)
Foley attemped.  Difficulty visualising the urethra.  Some trauma noted to vagina.  Dr. Magnus Ivan made aware.   S.Aiyana Stegmann, RN     None

## 2021-10-15 NOTE — Brief Op Note (Signed)
10/15/2021  11:59 AM  PATIENT:  Audrey Gomez  87 y.o. female  PRE-OPERATIVE DIAGNOSIS:  osteoarthritis right hip  POST-OPERATIVE DIAGNOSIS:  osteoarthritis right hip  PROCEDURE:  Procedure(s): RIGHT TOTAL HIP ARTHROPLASTY ANTERIOR APPROACH (Right)  SURGEON:  Surgeon(s) and Role:    Kathryne Hitch, MD - Primary  PHYSICIAN ASSISTANT:  Rexene Edison, PA-C  ANESTHESIA:   spinal  EBL:  200 mL   COUNTS:  YES  DICTATION: .Other Dictation: Dictation Number [id number]  PLAN OF CARE: Admit for overnight observation  PATIENT DISPOSITION:  PACU - hemodynamically stable.   Delay start of Pharmacological VTE agent (>24hrs) due to surgical blood loss or risk of bleeding: no

## 2021-10-15 NOTE — Anesthesia Procedure Notes (Signed)
Spinal  Patient location during procedure: OR Start time: 10/15/2021 10:28 AM End time: 10/15/2021 10:38 AM Reason for block: surgical anesthesia Staffing Performed: anesthesiologist  Anesthesiologist: Heather Roberts, MD Preanesthetic Checklist Completed: patient identified, IV checked, risks and benefits discussed, surgical consent, monitors and equipment checked, pre-op evaluation and timeout performed Spinal Block Patient position: sitting Prep: DuraPrep Patient monitoring: cardiac monitor, continuous pulse ox and blood pressure Approach: midline Location: L2-3 Injection technique: single-shot Needle Needle type: Pencan  Needle gauge: 24 G Needle length: 9 cm Assessment Events: CSF return Additional Notes Functioning IV was confirmed and monitors were applied. Sterile prep and drape, including hand hygiene and sterile gloves were used. The patient was positioned and the spine was prepped. The skin was anesthetized with lidocaine.  Free flow of clear CSF was obtained prior to injecting local anesthetic into the CSF.  The spinal needle aspirated freely following injection.  The needle was carefully withdrawn.  The patient tolerated the procedure well.

## 2021-10-15 NOTE — Op Note (Signed)
NAME: Audrey Gomez, Audrey Gomez MEDICAL RECORD NO: [id number] ACCOUNT NO: [id number] DATE OF BIRTH: 1931-10-27 FACILITY: [facility] LOCATION: [facility] PHYSICIAN: Vanita Panda. Magnus Ivan, MD  Operative Report   DATE OF PROCEDURE: 10/15/2021  PREOPERATIVE DIAGNOSIS:  Primary osteoarthritis and degenerative joint disease, right hip.  POSTOPERATIVE DIAGNOSIS:  Primary osteoarthritis and degenerative joint disease, right hip.  PROCEDURE:  Right total hip arthroplasty through direct anterior approach.  IMPLANTS:  DePuy sector Gription acetabular component size 52, size 36+4 neutral polyethylene liner, size 12 Corail femoral component with standard offset, size 36+5 metal hip ball.  SURGEON:  Vanita Panda. Magnus Ivan, MD  ASSISTANT:  Rexene Edison, PA-C.  ANESTHESIA:  Spinal.  ANTIBIOTICS:  1 gram IV vancomycin.  ESTIMATED BLOOD LOSS:  200 mL  COMPLICATIONS:  None.  INDICATIONS:  The patient is an 87 year old female well known to me.  She has debilitating end-stage arthritis of her right hip and is detrimentally affecting her mobility, her quality of life and her activities of daily living to the point she does wish  to proceed with a total hip arthroplasty.  She has x-rays showing severe end-stage arthritis of the right hip.  Her pain is daily and it is severe.  She understands with the surgery, there is risk of acute blood loss anemia, nerve or vessel injury,  fracture, infection, dislocation, DVT, implant failure and skin and soft tissue issues as well as leg length differences.  She understands our goals are to decrease pain, improve mobility and overall improve quality of life.  DESCRIPTION OF PROCEDURE:  After informed consent was obtained, appropriate right hip was marked.  She was brought to the operating room and sat up on the stretcher where spinal anesthesia was obtained.  She was laid in supine position on the stretcher.   Traction boots were placed on both her feet.  Next, she was placed supine on  the Hana fracture table, the perineal post in place and both legs in line skeletal traction device and no traction applied.  Her right operative hip was prepped and draped with  DuraPrep and sterile drapes.  A timeout was called.  She was identified correct patient, correct right hip.  I then made an incision just inferior and posterior to the anterior superior iliac spine and carried this obliquely down the leg.  We dissected  down tensor fascia lata muscle.  Tensor fascia was then divided longitudinally to proceed with direct anterior approach to the hip.  We identified and cauterized circumflex vessels and identified the hip capsule, opened the hip capsule in an L-type  format finding a very large joint effusion and significant synovitis.  We then made a femoral neck cut with an oscillating saw just proximal to the lesser trochanter and completed this with an osteotome.  We placed a corkscrew guide in the femoral head  and removed the femoral head in its entirety and found a wide area devoid of cartilage and then placed a bent Hohmann over the medial acetabular rim and removed remnants of acetabular labrum and other debris and then we began reaming in a stepwise  increments from a size 43 reamer going up to size 51 reamer with all reamers placed under direct visualization.  Last reamer was also placed under direct fluoroscopy, so I could obtain my depth of reaming my inclination and anteversion.  I then placed  real DePuy sector Gription acetabular component size 52 and a 36+4 polyethylene liner based on medialization.  Attention was then turned to the femur.  With  the leg externally rotated to 120 degrees and extended and adducted we were able to place a  Mueller retractor medially and a Hohmann retractor above the greater trochanter, we released lateral joint capsule and used a box cutting osteotome to enter the femoral canal and a rongeur to lateralize, then began broaching using the Corail broaching   system from a size 8 going up to a size 12.  With a size 12 in place, we trialed a standard offset femoral neck and a 36+1.5 trial hip ball.  We reduced this in the acetabulum.  We felt like we needed just a little bit more leg length than offset.  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  We dislocated the hip and removed the trial components.  We placed the real Corail femoral component, size 12 with standard offset and we went with a real 36+5 metal hip  ball and again reduced this in acetabulum and it was stable.  We were pleased radiographically and mechanically assessing the hip.  We then irrigated the soft tissue with normal saline solution using pulsatile lavage.  The joint capsule closed with  interrupted #1 Ethibond suture followed by #1 Vicryl to close the tensor fascia.  0 Vicryl was used to close the deep tissue, 2-0 Vicryl was used to close subcutaneous tissue.  The skin was reapproximated with staples.  An Aquacel dressing was applied.   She was taken off the Hana table and taken to recovery room in stable condition with all final counts being correct.  No complications noted.  Of note, Benita Stabile, PA-C did assist during the entire case.  His assistance was crucial for facilitating all  aspects of this case.   PUS D: 10/15/2021 11:58:01 am T: 10/15/2021 12:26:00 pm  JOB: [id number]

## 2021-10-15 NOTE — Transfer of Care (Signed)
Immediate Anesthesia Transfer of Care Note  Patient: Audrey Gomez  Procedure(s) Performed: RIGHT TOTAL HIP ARTHROPLASTY ANTERIOR APPROACH (Right: Hip)  Patient Location: PACU  Anesthesia Type:MAC and Spinal  Level of Consciousness: drowsy and patient cooperative  Airway & Oxygen Therapy: Patient Spontanous Breathing and Patient connected to face mask oxygen  Post-op Assessment: Report given to RN, Post -op Vital signs reviewed and stable and Patient moving all extremities  Post vital signs: Reviewed and stable  Last Vitals:  Vitals Value Taken Time  BP 105/63 10/15/21 1209  Temp    Pulse 73 10/15/21 1211  Resp 15 10/15/21 1211  SpO2 99 % 10/15/21 1211  Vitals shown include unvalidated device data.  Last Pain:  Vitals:   10/15/21 9432  PainSc: 10-Worst pain ever      Patients Stated Pain Goal: 3 (00/37/94 4461)  Complications: No notable events documented.

## 2021-10-15 NOTE — Anesthesia Postprocedure Evaluation (Signed)
Anesthesia Post Note  Patient: Audrey Gomez  Procedure(s) Performed: RIGHT TOTAL HIP ARTHROPLASTY ANTERIOR APPROACH (Right: Hip)     Patient location during evaluation: PACU Anesthesia Type: Spinal Level of consciousness: awake and alert Pain management: pain level controlled Vital Signs Assessment: post-procedure vital signs reviewed and stable Respiratory status: spontaneous breathing and respiratory function stable Cardiovascular status: blood pressure returned to baseline and stable Postop Assessment: spinal receding Anesthetic complications: no   No notable events documented.  Last Vitals:  Vitals:   10/15/21 1255 10/15/21 1310  BP: 119/74 127/80  Pulse: 63 60  Resp: 18 (!) 23  Temp:    SpO2: 96% 97%    Last Pain:  Vitals:   10/15/21 1240  PainSc: 0-No pain                 Danni Shima DANIEL

## 2021-10-15 NOTE — TOC Initial Note (Signed)
Transition of Care [facility]) - Initial/Assessment Note    Patient Details  Name: Audrey Gomez MRN: [id number] Date of Birth: [birth year]  Transition of Care [facility]) CM/SW Contact:    Epifanio Lesches, RN Phone Number: 10/15/2021, 4:25 PM  Clinical Narrative:                     - s/p Right total hip arthroplasty, 11/29 NCM spoke with pt regarding d/c planning. Pt is from home alone . Supportive daughter, Jasmine December. Pt states PTA independent with ADL's. Owns rolling walker and cane.   NCM shared PT evaluation pending.... Pt thinking she will need SNF rehab. Placement ,? preference [facility].  TOC  team following and will assist with TOC  needs.  Expected Discharge Plan: Skilled Nursing Facility Barriers to Discharge: Continued Medical Work up   Patient Goals and CMS Choice Patient states their goals for this hospitalization and ongoing recovery are:: hope to walk without a device      Expected Discharge Plan and Services Expected Discharge Plan: Skilled Nursing Facility   Discharge Planning Services: CM Consult                                          Prior Living Arrangements/Services     Patient language and need for interpreter reviewed:: Yes Do you feel safe going back to the place where you live?: Yes      Need for Family Participation in Patient Care: Yes (Comment) Care giver support system in place?: Yes (comment)   Criminal Activity/Legal Involvement Pertinent to Current Situation/Hospitalization: No - Comment as needed  Activities of Daily Living      Permission Sought/Granted                  Emotional Assessment Appearance:: Appears stated age Attitude/Demeanor/Rapport: Gracious Affect (typically observed): Accepting Orientation: : Oriented to Self, Oriented to Place, Oriented to  Time, Oriented to Situation Alcohol / Substance Use: Not Applicable Psych Involvement: No (comment)  Admission diagnosis:  Status post total  replacement of right hip [Z96.641] Patient Active Problem List   Diagnosis Date Noted   Unilateral primary osteoarthritis, right hip 10/15/2021   Status post total replacement of right hip 10/15/2021   PCP:  Ignacia Palma, MD Pharmacy:   [facility] [address] [place] - [address] [place] [address] Phone: [phone] Fax: [phone]     Social Determinants of Health (SDOH) Interventions    Readmission Risk Interventions No flowsheet data found.

## 2021-10-15 NOTE — Evaluation (Signed)
Physical Therapy Evaluation Patient Details Name: Audrey Gomez MRN: [id number] DOB: 09-Aug-1931 Today's Date: 10/15/2021  History of Present Illness  Pt is an 87 y/o female s/p R THA, direct anterior. PMH includes HTN and pacemaker.  Clinical Impression  Pt admitted secondary to problem above with deficits below. Pt with increased pain, so mobility limited. REquired mod A for bed mobility and min A to stand at EOB. Very limited weightshift onto RLE. Per pt, she plans to go to SNF at d/c. Prefers [facility] in [place]. Will continue to follow acutely.        Recommendations for follow up therapy are one component of a multi-disciplinary discharge planning process, led by the attending physician.  Recommendations may be updated based on patient status, additional functional criteria and insurance authorization.  Follow Up Recommendations Follow physician's recommendations for discharge plan and follow up therapies (Pt reports she plans to go SNF)    Assistance Recommended at Discharge Frequent or constant Supervision/Assistance  Functional Status Assessment Patient has had a recent decline in their functional status and demonstrates the ability to make significant improvements in function in a reasonable and predictable amount of time.  Equipment Recommendations  Rolling walker (2 wheels);BSC/3in1    Recommendations for Other Services       Precautions / Restrictions Precautions Precautions: Fall Restrictions Weight Bearing Restrictions: Yes RLE Weight Bearing: Weight bearing as tolerated      Mobility  Bed Mobility Overal bed mobility: Needs Assistance Bed Mobility: Supine to Sit     Supine to sit: Mod assist     General bed mobility comments: Mod A for trunk assist and scooting hips to EOB    Transfers Overall transfer level: Needs assistance Equipment used: Rolling walker (2 wheels) Transfers: Sit to/from Stand Sit to Stand: Min assist           General  transfer comment: Required assist for lift assist and steadying. Pt with limited weightbearing on RLE secondary to pain so further mobility deferred.    Ambulation/Gait                  Stairs            Wheelchair Mobility    Modified Rankin (Stroke Patients Only)       Balance Overall balance assessment: Needs assistance Sitting-balance support: No upper extremity supported;Feet supported Sitting balance-Leahy Scale: Fair     Standing balance support: Bilateral upper extremity supported;During functional activity Standing balance-Leahy Scale: Poor Standing balance comment: Reliant on BUE support                             Pertinent Vitals/Pain Pain Assessment: Faces Faces Pain Scale: Hurts whole lot Pain Location: R hip Pain Descriptors / Indicators: Guarding;Grimacing;Moaning Pain Intervention(s): Monitored during session;Limited activity within patient's tolerance;Repositioned    Home Living Family/patient expects to be discharged to:: Skilled nursing facility                        Prior Function Prior Level of Function : Independent/Modified Independent             Mobility Comments: Was using RW for mobility       Hand Dominance        Extremity/Trunk Assessment   Upper Extremity Assessment Upper Extremity Assessment: Overall WFL for tasks assessed    Lower Extremity Assessment Lower Extremity Assessment: RLE deficits/detail RLE Deficits /  Details: Deficits consistent with post op pain and weakness.    Cervical / Trunk Assessment Cervical / Trunk Assessment: Normal  Communication   Communication: No difficulties  Cognition Arousal/Alertness: Awake/alert Behavior During Therapy: WFL for tasks assessed/performed Overall Cognitive Status: Within Functional Limits for tasks assessed                                          General Comments General comments (skin integrity, edema, etc.):  Pt's daughter present    Exercises     Assessment/Plan    PT Assessment Patient needs continued PT services  PT Problem List Decreased strength;Decreased range of motion;Decreased activity tolerance;Decreased balance;Decreased mobility;Decreased knowledge of use of DME;Decreased knowledge of precautions;Pain       PT Treatment Interventions DME instruction;Gait training;Functional mobility training;Therapeutic exercise;Therapeutic activities;Balance training;Patient/family education;Stair training    PT Goals (Current goals can be found in the Care Plan section)  Acute Rehab PT Goals Patient Stated Goal: to go to SNF before going home PT Goal Formulation: With patient/family Time For Goal Achievement: 10/29/21 Potential to Achieve Goals: Good    Frequency 7X/week   Barriers to discharge        Co-evaluation               AM-PAC PT "6 Clicks" Mobility  Outcome Measure Help needed turning from your back to your side while in a flat bed without using bedrails?: A Little Help needed moving from lying on your back to sitting on the side of a flat bed without using bedrails?: A Lot Help needed moving to and from a bed to a chair (including a wheelchair)?: A Little Help needed standing up from a chair using your arms (e.g., wheelchair or bedside chair)?: A Little Help needed to walk in hospital room?: A Lot Help needed climbing 3-5 steps with a railing? : A Lot 6 Click Score: 15    End of Session Equipment Utilized During Treatment: Gait belt Activity Tolerance: Patient limited by pain Patient left: in bed;with call bell/phone within reach;with bed alarm set (sitting EOB eating dinner) Nurse Communication: Mobility status;Other (comment);Patient requests pain meds (pt sitting EOB) PT Visit Diagnosis: Unsteadiness on feet (R26.81);Muscle weakness (generalized) (M62.81);Pain Pain - Right/Left: Right Pain - part of body: Hip    Time: 6333-5456 PT Time Calculation (min)  (ACUTE ONLY): 18 min   Charges:   PT Evaluation $PT Eval Low Complexity: 1 Low          Cindee Salt, DPT  Acute Rehabilitation Services  Pager: [phone] Office: [phone]   Lehman Prom 10/15/2021, 4:39 PM

## 2021-10-15 NOTE — Anesthesia Procedure Notes (Signed)
Procedure Name: MAC Date/Time: 10/15/2021 10:42 AM Performed by: Annamary Carolin, CRNA Pre-anesthesia Checklist: Patient identified, Emergency Drugs available, Suction available and Patient being monitored Patient Re-evaluated:Patient Re-evaluated prior to induction Oxygen Delivery Method: Circle system utilized Preoxygenation: Pre-oxygenation with 100% oxygen Induction Type: IV induction Dental Injury: Teeth and Oropharynx as per pre-operative assessment

## 2021-10-16 LAB — CBC
HCT: 35.9 % — ABNORMAL LOW (ref 36.0–46.0)
Hemoglobin: 11.3 g/dL — ABNORMAL LOW (ref 12.0–15.0)
MCH: 29.9 pg (ref 26.0–34.0)
MCHC: 31.5 g/dL (ref 30.0–36.0)
MCV: 95 fL (ref 80.0–100.0)
Platelets: 166 10*3/uL (ref 150–400)
RBC: 3.78 MIL/uL — ABNORMAL LOW (ref 3.87–5.11)
RDW: 13.8 % (ref 11.5–15.5)
WBC: 7.9 10*3/uL (ref 4.0–10.5)
nRBC: 0 % (ref 0.0–0.2)

## 2021-10-16 LAB — BASIC METABOLIC PANEL
Anion gap: 3 — ABNORMAL LOW (ref 5–15)
BUN: 13 mg/dL (ref 8–23)
CO2: 28 mmol/L (ref 22–32)
Calcium: 8.4 mg/dL — ABNORMAL LOW (ref 8.9–10.3)
Chloride: 102 mmol/L (ref 98–111)
Creatinine, Ser: 0.94 mg/dL (ref 0.44–1.00)
GFR, Estimated: 58 mL/min — ABNORMAL LOW (ref >60)
Glucose, Bld: 110 mg/dL — ABNORMAL HIGH (ref 70–99)
Potassium: 4.4 mmol/L (ref 3.5–5.1)
Sodium: 133 mmol/L — ABNORMAL LOW (ref 135–145)

## 2021-10-16 NOTE — TOC Progression Note (Addendum)
Transition of Care [facility]) - Progression Note    Patient Details  Name: Paytyn Mesta MRN: [id number] Date of Birth: Sep 09, 1931  Transition of Care [facility]) CM/SW Contact  Lorri Frederick, LCSW Phone Number: 10/16/2021, 1:40 PM  Clinical Narrative:   CSW spoke with pt and daughter Jasmine December regarding PT recommendation for SNF.  Permission given to speak with daughter present.  They are agreeable to SNF, choice document given, permission given to send out referral.  Pt is from [facility].  Would like admission to [facility] in [place] or [facility].  Pt is vaccinated for covid with 2 boosters.   Referral emailed to [facility] in admissions at [facility], sent to [facility] through hub.      Expected Discharge Plan: Skilled Nursing Facility Barriers to Discharge: Continued Medical Work up  Expected Discharge Plan and Services Expected Discharge Plan: Skilled Nursing Facility   Discharge Planning Services: CM Consult                                           Social Determinants of Health (SDOH) Interventions    Readmission Risk Interventions No flowsheet data found.

## 2021-10-16 NOTE — Plan of Care (Signed)

## 2021-10-16 NOTE — Care Management Obs Status (Signed)
MEDICARE OBSERVATION STATUS NOTIFICATION   Patient Details  Name: Audrey Gomez MRN: [id number] Date of Birth: 09-11-1931   Medicare Observation Status Notification Given:  Yes    Lorri Frederick, LCSW 10/16/2021, 1:13 PM

## 2021-10-16 NOTE — Progress Notes (Signed)
Physical Therapy Treatment Patient Details Name: Audrey Gomez MRN: [id number] DOB: 08/25/1931 Today's Date: 10/16/2021   History of Present Illness Pt is an 87 y/o female s/p R THA, direct anterior. PMH includes HTN and pacemaker.    PT Comments    Continuing work on functional mobility and activity tolerance;  Session focused on watching BPs close while still getting OOB to recliner; BPs remained soft, but did not decr with progressing activity, and pt reported no syncopal symptoms with getting OOB to recliner; Pt seemed to enjoy marching in place; Recommend continued monitoring of BPs with activity; I anticipate good progress  Recommendations for follow up therapy are one component of a multi-disciplinary discharge planning process, led by the attending physician.  Recommendations may be updated based on patient status, additional functional criteria and insurance authorization.  Follow Up Recommendations  Follow physician's recommendations for discharge plan and follow up therapies (Pt reports she plans to go SNF)     Assistance Recommended at Discharge Frequent or constant Supervision/Assistance  Equipment Recommendations  Rolling walker (2 wheels);BSC/3in1    Recommendations for Other Services       Precautions / Restrictions Precautions Precautions: Fall Precaution Comments: Watch BPs in standing upright Restrictions RLE Weight Bearing: Weight bearing as tolerated     Mobility  Bed Mobility Overal bed mobility: Needs Assistance Bed Mobility: Supine to Sit     Supine to sit: Mod assist     General bed mobility comments: Cues for technique; Light Mod A for trunk assist and scooting hips to EOB    Transfers Overall transfer level: Needs assistance Equipment used: Rolling walker (2 wheels) Transfers: Sit to/from Stand Sit to Stand: Min guard           General transfer comment: Cues for hand placement and technqiue -- as well as to self-monitor fro  activity tolerance, given low BPs; very nice rise    Ambulation/Gait Ambulation/Gait assistance: Min guard Gait Distance (Feet): 3 Feet (pivot steps bed to chair followed by marching in place) Assistive device: Rolling walker (2 wheels) Gait Pattern/deviations: Step-to pattern       General Gait Details: Cues to self-monitor for activity tolerance; No syncopal symptoms, and pt able to march in place for 20 "steps", standing rest break, and then 20 more   Stairs             Wheelchair Mobility    Modified Rankin (Stroke Patients Only)       Balance     Sitting balance-Leahy Scale: Fair       Standing balance-Leahy Scale: Poor (approaching Fair) Standing balance comment: Reliant on BUE support                            Cognition Arousal/Alertness: Awake/alert Behavior During Therapy: WFL for tasks assessed/performed Overall Cognitive Status: Within Functional Limits for tasks assessed                                          Exercises Total Joint Exercises Ankle Circles/Pumps: AROM;Both;10 reps Quad Sets: AROM;Right;10 reps Gluteal Sets: AROM;Both;10 reps Towel Squeeze: AROM;Both;10 reps Heel Slides: AAROM;Right;10 reps Hip ABduction/ADduction: AAROM;Right;10 reps    General Comments General comments (skin integrity, edema, etc.): Monitored BPs in lying, sitting EOB, and then post transfer to the recliner; all were on the low side, but did not  drop considerably with activity, and pt was without syncopal symptoms, so opted to get OOB to recliner      Pertinent Vitals/Pain Pain Assessment: Faces Faces Pain Scale: Hurts little more Pain Location: R hip Pain Descriptors / Indicators: Guarding Pain Intervention(s): Monitored during session    Home Living                          Prior Function            PT Goals (current goals can now be found in the care plan section) Acute Rehab PT Goals Patient Stated  Goal: to go to SNF before going home PT Goal Formulation: With patient/family Time For Goal Achievement: 10/29/21 Potential to Achieve Goals: Good Progress towards PT goals: Progressing toward goals    Frequency    7X/week      PT Plan Current plan remains appropriate    Co-evaluation              AM-PAC PT "6 Clicks" Mobility   Outcome Measure  Help needed turning from your back to your side while in a flat bed without using bedrails?: A Little Help needed moving from lying on your back to sitting on the side of a flat bed without using bedrails?: A Lot Help needed moving to and from a bed to a chair (including a wheelchair)?: A Little Help needed standing up from a chair using your arms (e.g., wheelchair or bedside chair)?: A Little Help needed to walk in hospital room?: A Lot Help needed climbing 3-5 steps with a railing? : A Lot 6 Click Score: 15    End of Session Equipment Utilized During Treatment: Gait belt Activity Tolerance: Patient tolerated treatment well Patient left: in chair;with call bell/phone within reach;with family/visitor present Nurse Communication: Mobility status;Other (comment) (BPs soft, but normal response to activity) PT Visit Diagnosis: Unsteadiness on feet (R26.81);Muscle weakness (generalized) (M62.81);Pain Pain - Right/Left: Right Pain - part of body: Hip     Time: 2633-3545 PT Time Calculation (min) (ACUTE ONLY): 39 min  Charges:  $Gait Training: 8-22 mins $Therapeutic Exercise: 8-22 mins $Therapeutic Activity: 8-22 mins                     Van Clines, PT  Acute Rehabilitation Services Pager [phone] Office [phone]    [name] 10/16/2021, 2:32 PM

## 2021-10-16 NOTE — Progress Notes (Signed)
Subjective: 1 Day Post-Op Procedure(s) (LRB): RIGHT TOTAL HIP ARTHROPLASTY ANTERIOR APPROACH (Right) Patient reports pain as moderate.  Her H&H is stable.  Her acute blood loss anemia is minimal.  There is some hypotension but I believe this is related more to pain medication.  Objective: Vital signs in last 24 hours: Temp:  [97 F (36.1 C)-100 F (37.8 C)] 98.9 F (37.2 C) (11/30 0522) Pulse Rate:  [58-75] 74 (11/30 0522) Resp:  [11-23] 16 (11/30 0522) BP: (80-147)/(48-93) 92/48 (11/30 0522) SpO2:  [96 %-100 %] 98 % (11/30 0522) Weight:  [75.8 kg] 75.8 kg (11/29 1441)  Intake/Output from previous day: 11/29 0701 - 11/30 0700 In: 650 [I.V.:650] Out: 200 [Blood:200] Intake/Output this shift: No intake/output data recorded.  Recent Labs    10/16/21 0256  HGB 11.3*   Recent Labs    10/16/21 0256  WBC 7.9  RBC 3.78*  HCT 35.9*  PLT 166   Recent Labs    10/16/21 0256  NA 133*  K 4.4  CL 102  CO2 28  BUN 13  CREATININE 0.94  GLUCOSE 110*  CALCIUM 8.4*   No results for input(s): LABPT, INR in the last 72 hours.  Sensation intact distally Intact pulses distally Dorsiflexion/Plantar flexion intact Incision: scant drainage   Assessment/Plan: 1 Day Post-Op Procedure(s) (LRB): RIGHT TOTAL HIP ARTHROPLASTY ANTERIOR APPROACH (Right) Up with therapy  The patient and family are requesting some type of rehab facility close to their home in [place].  The transitional care team is consulted for short-term placement.  She will need FL 2 on her chart.    Kathryne Hitch 10/16/2021, 7:46 AM

## 2021-10-16 NOTE — NC FL2 (Signed)
[place] MEDICAID FL2 LEVEL OF CARE SCREENING TOOL     IDENTIFICATION  Patient Name: Audrey Gomez Birthdate: July 07, 1931 Sex: female Admission Date (Current Location): 10/15/2021  [facility] and [place] Number:  Producer, television/film/video and Address:  [facility], [address], [place], [place] [address]      Provider Number: [id number]  Attending Physician Name and Address:  Kathryne Hitch  Relative Name and Phone Number:  Keeran,Sharon Daughter   [phone]    Current Level of Care: Hospital Recommended Level of Care: Skilled Nursing Facility Prior Approval Number:    Date Approved/Denied:   PASRR Number:    Discharge Plan: SNF    Current Diagnoses: Patient Active Problem List   Diagnosis Date Noted   Unilateral primary osteoarthritis, right hip 10/15/2021   Status post total replacement of right hip 10/15/2021    Orientation RESPIRATION BLADDER Height & Weight     Self, Time, Situation, Place  Normal Continent Weight: 167 lb 1.7 oz (75.8 kg) Height:  5\' 11"  (180.3 cm)  BEHAVIORAL SYMPTOMS/MOOD NEUROLOGICAL BOWEL NUTRITION STATUS      Continent Diet (refer to d/c summary)  AMBULATORY STATUS COMMUNICATION OF NEEDS Skin   Extensive Assist Verbally Surgical wounds (s/p Right total hip arthroplasty, 11/29)                       Personal Care Assistance Level of Assistance  Feeding, Dressing, Bathing Bathing Assistance: Maximum assistance Feeding assistance: Independent Dressing Assistance: Maximum assistance     Functional Limitations Info  Sight, Hearing, Speech Sight Info: Adequate Hearing Info: Adequate Speech Info: Adequate    SPECIAL CARE FACTORS FREQUENCY  PT (By licensed PT), OT (By licensed OT)     PT Frequency: 5x/week evaluate and treat              Contractures Contractures Info: Not present    Additional Factors Info  Code Status, Allergies Code Status Info: full code Allergies Info: :  Statins, Tetracycline           Current Medications (10/16/2021):  This is the current hospital active medication list Current Facility-Administered Medications  Medication Dose Route Frequency Provider Last Rate Last Admin   0.9 %  sodium chloride infusion   Intravenous Continuous 10/18/2021, MD 75 mL/hr at 10/15/21 1608 New Bag at 10/15/21 1608   acetaminophen (TYLENOL) tablet 325-650 mg  325-650 mg Oral Q6H PRN 10/17/21, MD       alum & mag hydroxide-simeth (MAALOX/MYLANTA) 200-200-20 MG/5ML suspension 30 mL  30 mL Oral Q4H PRN 05-26-2001, MD       aspirin chewable tablet 81 mg  81 mg Oral BID Kathryne Hitch, MD   81 mg at 10/16/21 0848   bisacodyl (DULCOLAX) suppository 10 mg  10 mg Rectal Daily PRN 10/18/21, MD       cholecalciferol (VITAMIN D) tablet 1,000 Units  1,000 Units Oral Weekly Kathryne Hitch, MD   1,000 Units at 10/15/21 1604   diphenhydrAMINE (BENADRYL) 12.5 MG/5ML elixir 12.5-25 mg  12.5-25 mg Oral Q4H PRN 12-17-1984, MD       docusate sodium (COLACE) capsule 100 mg  100 mg Oral BID Kathryne Hitch, MD   100 mg at 10/16/21 0846   gabapentin (NEURONTIN) tablet 600 mg  600 mg Oral TID 10/18/21, MD   600 mg at 10/16/21 0846   HYDROcodone-acetaminophen (NORCO) 7.5-325  MG per tablet 1-2 tablet  1-2 tablet Oral Q4H PRN Kathryne Hitch, MD   2 tablet at 10/15/21 2119   HYDROcodone-acetaminophen (NORCO/VICODIN) 5-325 MG per tablet 1-2 tablet  1-2 tablet Oral Q4H PRN Kathryne Hitch, MD   2 tablet at 10/16/21 0848   menthol-cetylpyridinium (CEPACOL) lozenge 3 mg  1 lozenge Oral PRN Kathryne Hitch, MD       Or   phenol (CHLORASEPTIC) mouth spray 1 spray  1 spray Mouth/Throat PRN Kathryne Hitch, MD       methocarbamol (ROBAXIN) tablet 500 mg  500 mg Oral Q6H PRN Kathryne Hitch, MD   500 mg at 10/16/21 0847   Or   methocarbamol  (ROBAXIN) 500 mg in dextrose 5 % 50 mL IVPB  500 mg Intravenous Q6H PRN Kathryne Hitch, MD       metoCLOPramide (REGLAN) tablet 5-10 mg  5-10 mg Oral Q8H PRN Kathryne Hitch, MD       Or   metoCLOPramide (REGLAN) injection 5-10 mg  5-10 mg Intravenous Q8H PRN Kathryne Hitch, MD       morphine 2 MG/ML injection 0.5-1 mg  0.5-1 mg Intravenous Q2H PRN Kathryne Hitch, MD   1 mg at 10/15/21 1818   nebivolol (BYSTOLIC) tablet 10 mg  10 mg Oral Daily Kathryne Hitch, MD   10 mg at 10/16/21 0848   ondansetron (ZOFRAN) tablet 4 mg  4 mg Oral Q6H PRN Kathryne Hitch, MD       Or   ondansetron [facility]) injection 4 mg  4 mg Intravenous Q6H PRN Kathryne Hitch, MD       pantoprazole (PROTONIX) EC tablet 40 mg  40 mg Oral Daily Kathryne Hitch, MD   40 mg at 10/16/21 0847   Plecanatide TABS 3 mg  3 mg Oral Daily Kathryne Hitch, MD       polyethylene glycol (MIRALAX / GLYCOLAX) packet 17 g  17 g Oral Daily PRN Kathryne Hitch, MD       traMADol Janean Sark) tablet 50 mg  50 mg Oral Q6H Kathryne Hitch, MD   50 mg at 10/16/21 6144     Discharge Medications: Please see discharge summary for a list of discharge medications.  Relevant Imaging Results:  Relevant Lab Results:   Additional Information ss# [id number]. Pt is vaccinated for covid with 2 boosters.  Lorri Frederick, LCSW

## 2021-10-16 NOTE — Progress Notes (Signed)
Pt's daughter called to inform staff that the BP on the continuous pulse ox bottomed out to zero again around 1330 for a few seconds, four different times. Pt does not exhibit or verbalize having any signs and symptoms at this time. Pt stated that she was feeling a little dizzy earlier in the shift and is a bit drowsy after the prn pain meds. Pt up in chair at this time.

## 2021-10-16 NOTE — Discharge Instructions (Signed)

## 2021-10-16 NOTE — Progress Notes (Signed)
Pt continues to complain of unmanageable pain of 8-9 out of 10. However, after giving pt her pain pills with her morning med pass, pt could be seen resting peacefully in bed. Pt's daughter also came to the RN station to express concerns of her continuous pulseox temporarily displayed a 0 for her heart rate and 86 for her O2 around 10 am. The monitors were switched out and no complaints have been made since.

## 2021-10-17 ENCOUNTER — Encounter (HOSPITAL_COMMUNITY): Payer: Self-pay | Admitting: Orthopaedic Surgery

## 2021-10-17 DIAGNOSIS — D62 Acute posthemorrhagic anemia: Secondary | ICD-10-CM | POA: Diagnosis not present

## 2021-10-17 DIAGNOSIS — Z888 Allergy status to other drugs, medicaments and biological substances status: Secondary | ICD-10-CM | POA: Diagnosis not present

## 2021-10-17 DIAGNOSIS — M24569 Contracture, unspecified knee: Secondary | ICD-10-CM | POA: Diagnosis present

## 2021-10-17 DIAGNOSIS — Z20822 Contact with and (suspected) exposure to covid-19: Secondary | ICD-10-CM | POA: Diagnosis present

## 2021-10-17 DIAGNOSIS — K219 Gastro-esophageal reflux disease without esophagitis: Secondary | ICD-10-CM | POA: Diagnosis present

## 2021-10-17 DIAGNOSIS — Z90710 Acquired absence of both cervix and uterus: Secondary | ICD-10-CM | POA: Diagnosis not present

## 2021-10-17 DIAGNOSIS — I878 Other specified disorders of veins: Secondary | ICD-10-CM | POA: Diagnosis present

## 2021-10-17 DIAGNOSIS — Z79899 Other long term (current) drug therapy: Secondary | ICD-10-CM | POA: Diagnosis not present

## 2021-10-17 DIAGNOSIS — I959 Hypotension, unspecified: Secondary | ICD-10-CM | POA: Diagnosis not present

## 2021-10-17 DIAGNOSIS — M1611 Unilateral primary osteoarthritis, right hip: Secondary | ICD-10-CM | POA: Diagnosis present

## 2021-10-17 DIAGNOSIS — I10 Essential (primary) hypertension: Secondary | ICD-10-CM | POA: Diagnosis present

## 2021-10-17 DIAGNOSIS — Z950 Presence of cardiac pacemaker: Secondary | ICD-10-CM | POA: Diagnosis not present

## 2021-10-17 MED ORDER — HYDROCODONE-ACETAMINOPHEN 5-325 MG PO TABS: 1.0000 | ORAL_TABLET | Freq: Four times a day (QID) | ORAL | 0 refills | Status: DC | PRN

## 2021-10-17 MED ORDER — ASPIRIN 81 MG PO CHEW: 81.0000 mg | CHEWABLE_TABLET | Freq: Two times a day (BID) | ORAL | 0 refills | Status: AC

## 2021-10-17 NOTE — TOC Progression Note (Addendum)
Transition of Care [facility]) - Progression Note    Patient Details  Name: Audrey Gomez MRN: [id number] Date of Birth: 28-Jul-1931  Transition of [facility]) CM/SW Contact  Lorri Frederick, LCSW Phone Number: 10/17/2021, 11:37 AM  Clinical Narrative:   CSW spoke with Trula Ore at [facility], still needs face sheet, this was emailed to her.  Per Macario Golds, pt in observation status but Medicare waiver still in place, so no 3 day stay needed for SNF.   1130: CSW LM with Trula Ore asking if they can make a bed offer.    [facility] cannot make bed offer.  PASSR was initiated yesterday but went to level 2 due to [name].    1215: TC call from [employer].  Her DON is tied up in an emergency, has to review the referral, not sure when she will be able to.  Will call as soon as she has a response.  1230: CSW spoke with pt and daughter in room, provided update, printed off more SNF options in [place] from [email or web address], suggested that we send out referral to other facilities.  They are hesitant, but agreed to review the options.       Expected Discharge Plan: Skilled Nursing Facility Barriers to Discharge: Continued Medical Work up  Expected Discharge Plan and Services Expected Discharge Plan: Skilled Nursing Facility   Discharge Planning Services: CM Consult                                           Social Determinants of Health (SDOH) Interventions    Readmission Risk Interventions No flowsheet data found.

## 2021-10-17 NOTE — Progress Notes (Addendum)
Physical Therapy Treatment Patient Details Name: Audrey Gomez MRN: [id number] DOB: 1931-03-28 Today's Date: 10/17/2021   History of Present Illness Pt is an 87 y/o female s/p R THA, direct anterior. PMH includes HTN and pacemaker.    PT Comments    Continuing work on functional mobility and activity tolerance;  Noting better BPs and better activity tolerance today, and pt was able to walk in the hallway; Pt seemed quite pleased with her walk today; somewhat dizzy after standing therex (unable to obtain a BP in that moment before sitting down); ended session with pt in chair in room, reclined, feet up, BP 113/57, MAP 71, HR 95  Overall progressing well; Anticipate continuing good progress at post-acute rehabilitation.   Recommendations for follow up therapy are one component of a multi-disciplinary discharge planning process, led by the attending physician.  Recommendations may be updated based on patient status, additional functional criteria and insurance authorization.  Follow Up Recommendations  Follow physician's recommendations for discharge plan and follow up therapies (Pt reports she plans to go SNF)     Assistance Recommended at Discharge Frequent or constant Supervision/Assistance  Equipment Recommendations  Rolling walker (2 wheels);BSC/3in1    Recommendations for Other Services       Precautions / Restrictions Precautions Precautions: Fall Precaution Comments: Orthostatic BPs checked out for lying, sitting, standing at zero minutes; Did not get a reading for standing at 3 minutes Restrictions RLE Weight Bearing: Weight bearing as tolerated     Mobility  Bed Mobility Overal bed mobility: Needs Assistance Bed Mobility: Supine to Sit     Supine to sit: Min guard     General bed mobility comments: [place] for safety; was very close to EOB upon getting up    Transfers Overall transfer level: Needs assistance Equipment used: Rolling walker (2  wheels) Transfers: Sit to/from Stand Sit to Stand: Min assist           General transfer comment: Min assist to steady at initial stand and to control descent to sit; more painful today with much slower rise    Ambulation/Gait Ambulation/Gait assistance: Min guard (with and without physical contact) Gait Distance (Feet): 70 Feet Assistive device: Rolling walker (2 wheels) Gait Pattern/deviations: Step-through pattern (emerging)       General Gait Details: Cues to self-monitor for activity tolerance; No syncopal symptoms, and pt able to walk in the hallway (chair pulled behind for safety); cues to stand tall on RLE to encourage quad and gluteal activation for stability in stance   Stairs             Wheelchair Mobility    Modified Rankin (Stroke Patients Only)       Balance     Sitting balance-Leahy Scale: Fair       Standing balance-Leahy Scale: Poor (approaching Fair)                              Cognition Arousal/Alertness: Awake/alert Behavior During Therapy: WFL for tasks assessed/performed Overall Cognitive Status: Within Functional Limits for tasks assessed                                          Exercises Total Joint Exercises Hip ABduction/ADduction: AROM;15 reps;Right;Standing Straight Leg Raises: Standing;AROM;Right;15 reps Knee Flexion: AROM;Right;15 reps;Standing Standing Hip Extension: AROM;Right;15 reps    General Comments  General comments (skin integrity, edema, etc.):   10/17/21 0900  Vital Signs  Patient Position (if appropriate) Orthostatic Vitals  Orthostatic Lying   BP- Lying 112/56 (Map 73)  Pulse- Lying 73  Orthostatic Sitting  BP- Sitting 133/76 (map 92)  Pulse- Sitting 84  Orthostatic Standing at 0 minutes  BP- Standing at 0 minutes 113/57 (map 71)  Pulse- Standing at 0 minutes 95  Orthostatic Standing at 3 minutes  BP- Standing at 3 minutes  (did not get a standing at 3 minutes, but  pt able to walkthe hallway and participate in standing therex without reports of syncopal sumptoms)        Pertinent Vitals/Pain Pain Assessment: 0-10 Pain Score: 8  Faces Pain Scale: Hurts little more Pain Location: R hip and knee Pain Descriptors / Indicators: Aching;Grimacing;Guarding    Home Living                          Prior Function            PT Goals (current goals can now be found in the care plan section) Acute Rehab PT Goals Patient Stated Goal: Hopes to walk more today PT Goal Formulation: With patient/family Time For Goal Achievement: 10/29/21 Potential to Achieve Goals: Good Progress towards PT goals: Progressing toward goals    Frequency    7X/week      PT Plan Current plan remains appropriate    Co-evaluation              AM-PAC PT "6 Clicks" Mobility   Outcome Measure  Help needed turning from your back to your side while in a flat bed without using bedrails?: A Little Help needed moving from lying on your back to sitting on the side of a flat bed without using bedrails?: A Little Help needed moving to and from a bed to a chair (including a wheelchair)?: A Little Help needed standing up from a chair using your arms (e.g., wheelchair or bedside chair)?: A Lot Help needed to walk in hospital room?: A Lot Help needed climbing 3-5 steps with a railing? : A Lot 6 Click Score: 15    End of Session Equipment Utilized During Treatment: Gait belt Activity Tolerance: Patient tolerated treatment well Patient left: in chair;with call bell/phone within reach;with nursing/sitter in room Nurse Communication: Mobility status PT Visit Diagnosis: Unsteadiness on feet (R26.81);Muscle weakness (generalized) (M62.81);Pain Pain - Right/Left: Right Pain - part of body: Hip     Time: 5329-9242 PT Time Calculation (min) (ACUTE ONLY): 35 min  Charges:  $Gait Training: 8-22 mins $Therapeutic Exercise: 8-22 mins                     Audrey Gomez, PT  Acute Rehabilitation Services Pager [phone] Office [phone]    Audrey Gomez 10/17/2021, 12:05 PM

## 2021-10-17 NOTE — Progress Notes (Signed)
Mobility Specialist Criteria Algorithm Info.  10/17/21 1018  Mobility  Activity Ambulated in room;Ambulated to bathroom (In chair before and after; Declined hallway ambulation NT in for bath)  Range of Motion/Exercises Active;All extremities  Level of Assistance Contact guard assist, steadying assist (Min A sit<>stand)  Assistive Device Front wheel walker  RLE Weight Bearing WBAT  Distance Ambulated (ft) 25 ft  Mobility Out of bed for toileting  Mobility Response Tolerated well  Mobility performed by Mobility specialist  Bed Position Chair   Patient received in chair willing and eager to participate in mobility, requesting assistance to restroom. Required min A sit<>stand from chair and was min guard ambulating to restroom. Requested to return to chair to get bath from NT. Tolerated ambulation well without complaint and was left in recliner with NT present.  10/17/2021 3:29 PM

## 2021-10-17 NOTE — Progress Notes (Signed)
Patient ID: Audrey Gomez, female   DOB: September 04, 1931, 87 y.o.   MRN: [id number] The patient is postoperative day 2 status post a right total hip arthroplasty.  Her vital signs are stable and she is doing well.  She is 87 years old.  The family and the patient requesting short-term skilled nursing placement.  She is working with therapy while she is here.  Social work has been consulted and an FL 2 has been signed.  I did change her to inpatient admission just now.  From an orthopedic standpoint, she can be discharged to skilled nursing once a bed is available and this is approved and authorized.  Her right operative hip is stable.

## 2021-10-18 LAB — RESP PANEL BY RT-PCR (FLU A&B, COVID) ARPGX2
Influenza A by PCR: NEGATIVE
Influenza B by PCR: NEGATIVE
SARS Coronavirus 2 by RT PCR: NEGATIVE

## 2021-10-18 LAB — SARS CORONAVIRUS 2 (TAT 6-24 HRS): SARS Coronavirus 2: NEGATIVE

## 2021-10-18 NOTE — Progress Notes (Signed)
Mobility Specialist Criteria Algorithm Info.   10/18/21 1030  Mobility  Activity Ambulated in hall;Dangled on edge of bed  Range of Motion/Exercises Active;All extremities  Level of Assistance Contact guard assist, steadying assist  Assistive Device Front wheel walker  RLE Weight Bearing WBAT  Distance Ambulated (ft) 200 ft  Mobility Ambulated with assistance in hallway  Mobility Response Tolerated well  Mobility performed by Mobility specialist  Bed Position Semi-fowlers   Patient received in bed very eager to participate in mobility with anticipation to be discharged to SNF today. Got to EOB independently and stood min A  >85% + cues for hand placement. Ambulated in hallway at min guard with slow steady gait. Needed x2 standing rest breaks secondary to fatigue. Oxygen saturated >90% on RA throughout. Completed education on energy conservation and ambulatory frequency with receptive teach back. Returned to room without complaint or incident, tolerated well. Was left dangling EOB with all needs met and family present.   10/18/2021 11:22 AM

## 2021-10-18 NOTE — Progress Notes (Signed)
Physical Therapy Treatment Patient Details Name: Audrey Gomez MRN: [id number] DOB: 29-Sep-1931 Today's Date: 10/18/2021   History of Present Illness Pt is an 87 y/o female s/p R THA, direct anterior. PMH includes HTN and pacemaker.    PT Comments    Patient progressing well with mobility. Session focused on progressive ambulation with close Min guard for safety. No dizziness reported during gait however pt with LOB back onto bed after walking resulting in uncontrolled descent onto bed. Tolerated standing there ex. Pt highly motivated to mobilize and get to rehab. BP stable. Will follow.    Recommendations for follow up therapy are one component of a multi-disciplinary discharge planning process, led by the attending physician.  Recommendations may be updated based on patient status, additional functional criteria and insurance authorization.  Follow Up Recommendations  Follow physician's recommendations for discharge plan and follow up therapies     Assistance Recommended at Discharge Frequent or constant Supervision/Assistance  Equipment Recommendations  Rolling walker (2 wheels);BSC/3in1    Recommendations for Other Services       Precautions / Restrictions Precautions Precautions: Fall Restrictions Weight Bearing Restrictions: Yes RLE Weight Bearing: Weight bearing as tolerated     Mobility  Bed Mobility               General bed mobility comments: Sitting EOB upon PT arrival.    Transfers Overall transfer level: Needs assistance Equipment used: Rolling walker (2 wheels) Transfers: Sit to/from Stand Sit to Stand: Min assist;From elevated surface           General transfer comment: Min assist to steady at initial stand and to control descent to sit; slow to rise, LOB back onto bed due to dizziness. Stood from [employer].    Ambulation/Gait Ambulation/Gait assistance: Min guard Gait Distance (Feet): 140 Feet Assistive device: Rolling walker (2  wheels) Gait Pattern/deviations: Step-through pattern;Antalgic Gait velocity: decreased Gait velocity interpretation: <1.31 ft/sec, indicative of household ambulator   General Gait Details: Cues for upright and for knee extension during stance to activate glutes/quads. A few standing rest breaks.   Stairs             Wheelchair Mobility    Modified Rankin (Stroke Patients Only)       Balance Overall balance assessment: Needs assistance Sitting-balance support: No upper extremity supported;Feet supported Sitting balance-Leahy Scale: Good     Standing balance support: During functional activity Standing balance-Leahy Scale: Poor Standing balance comment: Reliant on BUE support                            Cognition Arousal/Alertness: Awake/alert Behavior During Therapy: WFL for tasks assessed/performed Overall Cognitive Status: Within Functional Limits for tasks assessed                                          Exercises Total Joint Exercises Knee Flexion: AROM;Right;Standing;20 reps    General Comments General comments (skin integrity, edema, etc.): Daughter present during session.      Pertinent Vitals/Pain Pain Assessment: Faces Faces Pain Scale: Hurts little more Pain Location: Rt hip Pain Descriptors / Indicators: Aching;Guarding Pain Intervention(s): Monitored during session;Premedicated before session;Repositioned    Home Living                          Prior Function  PT Goals (current goals can now be found in the care plan section) Progress towards PT goals: Progressing toward goals    Frequency    7X/week      PT Plan Current plan remains appropriate    Co-evaluation              AM-PAC PT "6 Clicks" Mobility   Outcome Measure  Help needed turning from your back to your side while in a flat bed without using bedrails?: A Little Help needed moving from lying on your back to  sitting on the side of a flat bed without using bedrails?: A Little Help needed moving to and from a bed to a chair (including a wheelchair)?: A Little Help needed standing up from a chair using your arms (e.g., wheelchair or bedside chair)?: A Lot Help needed to walk in hospital room?: A Little Help needed climbing 3-5 steps with a railing? : A Lot 6 Click Score: 16    End of Session Equipment Utilized During Treatment: Gait belt Activity Tolerance: Patient tolerated treatment well Patient left: in bed;with call bell/phone within reach;with family/visitor present (sitting EOB) Nurse Communication: Mobility status PT Visit Diagnosis: Unsteadiness on feet (R26.81);Muscle weakness (generalized) (M62.81);Pain Pain - Right/Left: Right Pain - part of body: Hip     Time: 2992-4268 PT Time Calculation (min) (ACUTE ONLY): 26 min  Charges:  $Gait Training: 8-22 mins $Therapeutic Exercise: 8-22 mins                     Vale Haven, PT, DPT Acute Rehabilitation Services Pager [phone] Office [phone]      Blake Divine A Lanier Ensign 10/18/2021, 12:59 PM

## 2021-10-18 NOTE — TOC Progression Note (Addendum)
Transition of Care [facility]) - Progression Note    Patient Details  Name: Audrey Gomez MRN: [id number] Date of Birth: 07-28-1931  [facility]) CM/SW Contact  Joanne Chars, LCSW Phone Number: 10/18/2021, 10:10 AM  Clinical Narrative:   CSW called [facility] twice, LM.  No response on whether they can make a bed offer.  1000: CSW met with pt and daughter, updated them.  Discussed pursuing other rehab facilities.  They asked about [facility] and [facility].  Also said possibly [facility] in [place]. CSW asked for permission to send out referral to wider range of SNF, discussed options in [place], [place], [place].  They do not want to send out pt information to a large number of SNF, permission not given.  Asking or more time to identify specific facilities.  CSW will return at 11am.   11am: daughter Audrey Gomez on phone with [facility], gave phone to CSW, pt is accepted for STR, Rosemarie Beath was the staff member.  CSW spoke with [place] in admissions, emailed DC summary and script.   Covid still pending.      Expected Discharge Plan: [place] Barriers to Discharge: Continued Medical Work up  Expected Discharge Plan and Services Expected Discharge Plan: [place]   Discharge Planning Services: CM Consult     Expected Discharge Date: 10/18/21                                     Social Determinants of Health (SDOH) Interventions    Readmission Risk Interventions No flowsheet data found.

## 2021-10-18 NOTE — Care Management Important Message (Signed)
Important Message  Patient Details  Name: Audrey Gomez MRN: [id number] Date of Birth: Apr 29, 1931   Medicare Important Message Given:  Yes     Sherilyn Banker 10/18/2021, 1:26 PM

## 2021-10-18 NOTE — Discharge Summary (Signed)
.  dc

## 2021-10-18 NOTE — Discharge Summary (Signed)
Patient ID: Audrey Gomez MRN: [id number] DOB/AGE: 1930/12/07 87 y.o.  Admit date: 10/15/2021 Discharge date: 10/18/2021  Admission Diagnoses:  Principal Problem:   Unilateral primary osteoarthritis, right hip Active Problems:   Status post total replacement of right hip   Discharge Diagnoses:  Same  Past Medical History:  Diagnosis Date   Arthritis    GERD (gastroesophageal reflux disease)    Hypertension    Presence of permanent cardiac pacemaker     Surgeries: Procedure(s): RIGHT TOTAL HIP ARTHROPLASTY ANTERIOR APPROACH on 10/15/2021   Consultants:   Discharged Condition: Improved  Hospital Course: Audrey Gomez is an 87 y.o. female who was admitted 10/15/2021 for operative treatment ofUnilateral primary osteoarthritis, right hip. Patient has severe unremitting pain that affects sleep, daily activities, and work/hobbies. After pre-op clearance the patient was taken to the operating room on 10/15/2021 and underwent  Procedure(s): RIGHT TOTAL HIP ARTHROPLASTY ANTERIOR APPROACH.    Patient was given perioperative antibiotics:  Anti-infectives (From admission, onward)    Start     Dose/Rate Route Frequency Ordered Stop   10/15/21 2100  vancomycin (VANCOREADY) IVPB 1000 mg/200 mL        1,000 mg 200 mL/hr over 60 Minutes Intravenous Every 12 hours 10/15/21 1440 10/15/21 2229   10/15/21 0716  vancomycin (VANCOCIN) 1-5 GM/200ML-% IVPB       Note to Pharmacy: Kandice Hams D: cabinet override      10/15/21 0716 10/15/21 1929   10/15/21 0700  vancomycin (VANCOCIN) IVPB 1000 mg/200 mL premix        1,000 mg 200 mL/hr over 60 Minutes Intravenous On call 10/15/21 0652 10/15/21 1005   10/15/21 0700  ceFAZolin (ANCEF) IVPB 2g/100 mL premix  Status:  Discontinued        2 g 200 mL/hr over 30 Minutes Intravenous On call to O.R. 10/15/21 4854 10/15/21 1435        Patient was given sequential compression devices, early ambulation, and chemoprophylaxis to  prevent DVT.  Patient benefited maximally from hospital stay and there were no complications.    Recent vital signs: Patient Vitals for the past 24 hrs:  BP Temp Temp src Pulse Resp SpO2  10/18/21 0449 (!) 100/58 98 F (36.7 C) -- 69 20 100 %  10/17/21 1951 (!) 96/49 98.6 F (37 C) Oral 81 20 98 %  10/17/21 0742 108/68 98.8 F (37.1 C) Oral 77 18 99 %     Recent laboratory studies:  Recent Labs    10/16/21 0256  WBC 7.9  HGB 11.3*  HCT 35.9*  PLT 166  NA 133*  K 4.4  CL 102  CO2 28  BUN 13  CREATININE 0.94  GLUCOSE 110*  CALCIUM 8.4*     Discharge Medications:   Allergies as of 10/18/2021       Reactions   Statins Other (See Comments)   Other reaction(s): muscle/joint pain Severe pain   Tetracycline Itching, Rash        Medication List     STOP taking these medications    aspirin 81 MG EC tablet Replaced by: aspirin 81 MG chewable tablet   traMADol 50 MG tablet Commonly known as: ULTRAM       TAKE these medications    aspirin 81 MG chewable tablet Chew 1 tablet (81 mg total) by mouth 2 (two) times daily. Replaces: aspirin 81 MG EC tablet   Bystolic 10 MG tablet Generic drug: nebivolol Take 10 mg by mouth daily.   clobetasol cream  0.05 % Commonly known as: TEMOVATE Apply 1 application topically in the morning and at bedtime.   estradiol 0.1 MG/GM vaginal cream Commonly known as: ESTRACE Place 1 Applicatorful vaginally 3 (three) times a week.   gabapentin 600 MG tablet Commonly known as: NEURONTIN Take 600 mg by mouth in the morning, at noon, and at bedtime.   HYDROcodone-acetaminophen 5-325 MG tablet Commonly known as: NORCO/VICODIN Take 1-2 tablets by mouth every 6 (six) hours as needed for moderate pain (pain score 4-6).   Trulance 3 MG Tabs Generic drug: Plecanatide Take 3 mg by mouth daily.   VITAMIN B12-FOLIC ACID PO Take 1 tablet by mouth daily.   Vitamin D3 250 MCG (10000 UT) Tabs Take 1,000 Units by mouth once a  week.               Durable Medical Equipment  (From admission, onward)           Start     Ordered   10/15/21 1441  DME 3 n 1  Once        10/15/21 1440   10/15/21 1441  DME Walker rolling  Once       Question Answer Comment  Walker: With 5 Inch Wheels   Patient needs a walker to treat with the following condition Status post total hip replacement, right      10/15/21 1440            Diagnostic Studies: DG Pelvis Portable  Result Date: 10/15/2021 CLINICAL DATA:  Status post right total hip arthroplasty. EXAM: PORTABLE PELVIS 1-2 VIEWS COMPARISON:  Fluoroscopic images of same day. FINDINGS: Right femoral and acetabular components are well situated. Expected postoperative changes are seen in the surrounding soft tissues. IMPRESSION: Status post right total hip arthroplasty. Electronically Signed   By: Lupita Raider M.D.   On: 10/15/2021 13:21   DG C-Arm 1-60 Min-No Report  Result Date: 10/15/2021 CLINICAL DATA:  Right total hip replacement. EXAM: OPERATIVE RIGHT HIP (WITH PELVIS IF PERFORMED) 5 VIEWS TECHNIQUE: Fluoroscopic spot image(s) were submitted for interpretation post-operatively. COMPARISON:  08/19/2021 FINDINGS: Five spot intraoperative fluoroscopic images of the right hip and lower pelvis are provided for review and demonstrate the sequela of right total hip replacement. Alignment appears anatomic. There is a minimal amount of subcutaneous emphysema about the operative site. Phleboliths overlie the right hemipelvis. A calcified fibroid overlies the left hemipelvis. No radiopaque foreign body. IMPRESSION: Post right total hip replacement. Electronically Signed   By: Simonne Come M.D.   On: 10/15/2021 12:17   DG HIP OPERATIVE UNILAT WITH PELVIS RIGHT  Result Date: 10/15/2021 CLINICAL DATA:  Right total hip replacement. EXAM: OPERATIVE RIGHT HIP (WITH PELVIS IF PERFORMED) 5 VIEWS TECHNIQUE: Fluoroscopic spot image(s) were submitted for interpretation  post-operatively. COMPARISON:  08/19/2021 FINDINGS: Five spot intraoperative fluoroscopic images of the right hip and lower pelvis are provided for review and demonstrate the sequela of right total hip replacement. Alignment appears anatomic. There is a minimal amount of subcutaneous emphysema about the operative site. Phleboliths overlie the right hemipelvis. A calcified fibroid overlies the left hemipelvis. No radiopaque foreign body. IMPRESSION: Post right total hip replacement. Electronically Signed   By: Simonne Come M.D.   On: 10/15/2021 12:17    Disposition: Discharge disposition: 03-Skilled Nursing Facility       Discharge Instructions     Discharge patient   Complete by: As directed    Discharge disposition: 03-Skilled Nursing Facility   Discharge patient date: 10/18/2021  Follow-up Information     Kathryne Hitch, MD Follow up in 2 week(s).   Specialty: Orthopedic Surgery Contact information: [address] [place] [address] [phone]                  Signed: Kathryne Hitch 10/18/2021, 6:45 AM

## 2021-10-18 NOTE — TOC Transition Note (Addendum)
Transition of Care [facility]) - CM/SW Discharge Note   Patient Details  Name: Audrey Gomez MRN: [id number] Date of Birth: 1931-09-02  [facility] CM/SW Contact:  Lorri Frederick, LCSW Phone Number: 10/18/2021, 1:31 PM   Clinical Narrative:   Pt discharging to [facility] in [place].  RN call [phone] for report.  Covid test not yet back, transport has not been called.   1515:  Covid test appears to be 6-24 hours test, not rapid.  RN notified.  Pt put on standby for [facility] pending negative covid test.  Please call [facility] at [phone] to request transport when covid test is back.     Final next level of care: Skilled Nursing Facility Barriers to Discharge: Barriers Resolved   Patient Goals and CMS Choice Patient states their goals for this hospitalization and ongoing recovery are:: hope to walk without a device      Discharge Placement              Patient chooses bed at:  ([facility] [place]) Patient to be transferred to facility by: [facility] Name of family member notified: daughter Jasmine December in room Patient and family notified of of transfer: 10/18/21  Discharge Plan and Services   Discharge Planning Services: CM Consult                                 Social Determinants of Health (SDOH) Interventions     Readmission Risk Interventions No flowsheet data found.

## 2021-10-18 NOTE — Progress Notes (Signed)
Patient ID: Audrey Gomez, female   DOB: Apr 21, 1931, 87 y.o.   MRN: [id number] There has been no acute changes over the last 24 hours.  The patient's right operative hip is stable.  The dressing is clean and dry.  She does get slightly hypotensive when she stands and this is mainly when she is on pain medication as well.  She is otherwise stable and can be discharged to short-term skilled nursing this afternoon if a bed is available.

## 2021-10-28 ENCOUNTER — Encounter: Payer: Self-pay | Admitting: Orthopaedic Surgery

## 2021-10-28 ENCOUNTER — Ambulatory Visit (INDEPENDENT_AMBULATORY_CARE_PROVIDER_SITE_OTHER): Payer: Medicare Other | Admitting: Orthopaedic Surgery

## 2021-10-28 DIAGNOSIS — Z96641 Presence of right artificial hip joint: Secondary | ICD-10-CM

## 2021-10-28 NOTE — Progress Notes (Signed)
The patient is 2 weeks today status post a right total hip arthroplasty.  She is 87 year old female and has been convalescing in short-term skilled nursing.  When I discharged her a look at her records and I put her on an 81 mg aspirin twice daily.  In the nursing care facility she is on Xarelto.  I am not sure who put her on Xarelto.  There is no evidence that she has been worked up for any type of blood clot or DVT.  She denies any calf pain.  On exam her right hip incision looks good.  I remove the staples and place Steri-Strips.  I did drain only about 30 cc of the seroma off of her hip.  She can alternate ice and heat when she is home.  She is scheduled to be discharged home I believe later this week.  I gave note to the nursing facility to stop Xarelto.  She will go back to just her baby aspirin daily.  She is still on hydrocodone and I think this reasonable.  If she needs pain medications between now in 4 weeks when I see her back she will let us know.  I did let her know that knee pain is normal after hip replacement surgery on the same side.  All question concerns were answered addressed.  In 4 weeks from now no x-rays are needed.

## 2021-10-29 ENCOUNTER — Telehealth: Payer: Self-pay | Admitting: Orthopaedic Surgery

## 2021-10-29 ENCOUNTER — Telehealth: Payer: Self-pay

## 2021-10-29 NOTE — Telephone Encounter (Signed)
Pt called and has some questions about a medication.  [phone]

## 2021-10-29 NOTE — Telephone Encounter (Signed)
Patient asking for pain medication sent to [facility] [address] [place]

## 2021-10-29 NOTE — Telephone Encounter (Signed)
Called patient, no answer voicemail full.

## 2021-10-29 NOTE — Telephone Encounter (Signed)
Pt calling to speak with Dr. Eliberto Ivory nurse. Would not speak with me or give me any information to add to the message. The best call back number is [phone]

## 2021-10-30 ENCOUNTER — Other Ambulatory Visit: Payer: Self-pay | Admitting: Orthopaedic Surgery

## 2021-10-30 MED ORDER — HYDROCODONE-ACETAMINOPHEN 5-325 MG PO TABS: 1.0000 | ORAL_TABLET | Freq: Four times a day (QID) | ORAL | 0 refills | Status: DC | PRN

## 2021-10-30 MED ORDER — HYDROCODONE-ACETAMINOPHEN 5-325 MG PO TABS: 1.0000 | ORAL_TABLET | Freq: Four times a day (QID) | ORAL | 0 refills | Status: AC | PRN

## 2021-11-04 ENCOUNTER — Telehealth: Payer: Self-pay | Admitting: Physician Assistant

## 2021-11-04 NOTE — Telephone Encounter (Signed)
Do we have a protocol for this to send them?

## 2021-11-04 NOTE — Telephone Encounter (Signed)
Neysa Bonito (physical therapist) from [facility] called requesting a call back for clarification for surgical approach of hip replacement. Please call Christy at [phone].

## 2021-11-05 NOTE — Telephone Encounter (Signed)
LMOM for Audrey Gomez below message

## 2021-11-25 ENCOUNTER — Ambulatory Visit (INDEPENDENT_AMBULATORY_CARE_PROVIDER_SITE_OTHER): Payer: Medicare Other | Admitting: Orthopaedic Surgery

## 2021-11-25 ENCOUNTER — Encounter: Payer: Self-pay | Admitting: Orthopaedic Surgery

## 2021-11-25 DIAGNOSIS — Z96641 Presence of right artificial hip joint: Secondary | ICD-10-CM

## 2021-11-25 NOTE — Progress Notes (Signed)
The patient is an 88 year old female who is now 6 weeks tomorrow status post a right total hip arthroplasty.  She is ambulating with a walker and has a few more weeks of home therapy.  I do feel that she has some chronic knee issues.  She is at the hip is doing well other than some numbness.  She does use a cane occasionally at home.  She is on a walker today.  On exam she does have slight valgus malalignment of both her knees more so on the left than the right.  Her right operative hip moves smoothly and fluidly.  She will continue home therapy.  I will see her back in 4 weeks and we can determine then whether or not she needs outpatient therapy.  All questions and concerns were answered and addressed.

## 2021-12-25 ENCOUNTER — Ambulatory Visit: Payer: Self-pay

## 2021-12-25 ENCOUNTER — Other Ambulatory Visit: Payer: Self-pay

## 2021-12-25 ENCOUNTER — Ambulatory Visit (INDEPENDENT_AMBULATORY_CARE_PROVIDER_SITE_OTHER): Payer: Medicare Other | Admitting: Orthopaedic Surgery

## 2021-12-25 DIAGNOSIS — G8929 Other chronic pain: Secondary | ICD-10-CM

## 2021-12-25 DIAGNOSIS — M25561 Pain in right knee: Secondary | ICD-10-CM | POA: Diagnosis not present

## 2021-12-25 DIAGNOSIS — M25562 Pain in left knee: Secondary | ICD-10-CM | POA: Diagnosis not present

## 2021-12-25 DIAGNOSIS — Z96641 Presence of right artificial hip joint: Secondary | ICD-10-CM | POA: Diagnosis not present

## 2021-12-25 MED ORDER — METHYLPREDNISOLONE ACETATE 40 MG/ML IJ SUSP
40.0000 mg | INTRAMUSCULAR | Status: AC | PRN
  Administered 2021-12-25: 40 mg via INTRA_ARTICULAR

## 2021-12-25 MED ORDER — LIDOCAINE HCL 1 % IJ SOLN
3.0000 mL | INTRAMUSCULAR | Status: AC | PRN
  Administered 2021-12-25: 3 mL

## 2021-12-25 NOTE — Progress Notes (Signed)
Office Visit Note   Patient: Audrey Gomez           Date of Birth: March 11, 1931           MRN: [id number] Visit Date: 12/25/2021              Requested by: Ignacia Palma, MD [address] [place],  [place] [address] PCP: Ignacia Palma, MD   Assessment & Plan: Visit Diagnoses:  1. Status post total replacement of right hip   2. Chronic pain of right knee   3. Chronic pain of left knee     Plan: Per the patient's request I did place a steroid injection in both knees which she tolerated well.  From my standpoint I will see her back in 3 months.  We will have a standing low AP pelvis at that visit and we can have a standing AP and lateral of both knees at that visit.    Follow-Up Instructions: Return in about 3 months (around 03/24/2022).   Orders:  Orders Placed This Encounter  Procedures   Large Joint Inj   Large Joint Inj   No orders of the defined types were placed in this encounter.     Procedures: Large Joint Inj: R knee on 12/25/2021 1:01 PM Indications: diagnostic evaluation and pain Details: 22 G 1.5 in needle, superolateral approach  Arthrogram: No  Medications: 3 mL lidocaine 1 %; 40 mg methylPREDNISolone acetate 40 MG/ML Outcome: tolerated well, no immediate complications Procedure, treatment alternatives, risks and benefits explained, specific risks discussed. Consent was given by the patient. Immediately prior to procedure a time out was called to verify the correct patient, procedure, equipment, support staff and site/side marked as required. Patient was prepped and draped in the usual sterile fashion.    Large Joint Inj: L knee on 12/25/2021 1:02 PM Indications: diagnostic evaluation and pain Details: 22 G 1.5 in needle, superolateral approach  Arthrogram: No  Medications: 3 mL lidocaine 1 %; 40 mg methylPREDNISolone acetate 40 MG/ML Outcome: tolerated well, no immediate complications Procedure, treatment alternatives, risks and benefits  explained, specific risks discussed. Consent was given by the patient. Immediately prior to procedure a time out was called to verify the correct patient, procedure, equipment, support staff and site/side marked as required. Patient was prepped and draped in the usual sterile fashion.      Clinical Data: No additional findings.   Subjective: Chief Complaint  Patient presents with   Right Hip - Follow-up  The patient is a 88 year old female who is just over 2 months out from a right hip replacement.  She said the right hip is doing well but her knees are stiff and she would like to consider injections in both her knees today.  She has never had any type of knee surgery.  She does ambulate with a cane.  She wants to get back to driving as well.  She also reports a stiff low back.  HPI  Review of Systems Today there is no fever, chills, nausea, vomiting  Objective: Vital Signs: There were no vitals taken for this visit.  Physical Exam She is alert and orient x3 and in no acute distress Ortho Exam Examination her right operative hip shows that it moves smoothly and fluidly and incision healed nicely.  Her right knee does have some valgus malalignment and some patellofemoral crepitation.  Her left knee has more neutral alignment but pain as well.  There is no effusion of  either knee.  She has good range of motion of both knees. Specialty Comments:  No specialty comments available.  Imaging: No results found.   PMFS History: Patient Active Problem List   Diagnosis Date Noted   Unilateral primary osteoarthritis, right hip 10/15/2021   Status post total replacement of right hip 10/15/2021   Past Medical History:  Diagnosis Date   Arthritis    GERD (gastroesophageal reflux disease)    Hypertension    Presence of permanent cardiac pacemaker     No family history on file.  Past Surgical History:  Procedure Laterality Date   ABDOMINAL HYSTERECTOMY     TOTAL HIP ARTHROPLASTY  Right 10/15/2021   Procedure: RIGHT TOTAL HIP ARTHROPLASTY ANTERIOR APPROACH;  Surgeon: Kathryne Hitch, MD;  Location: [facility] OR;  Service: Orthopedics;  Laterality: Right;   Social History   Occupational History   Not on file  Tobacco Use   Smoking status: Never   Smokeless tobacco: Never  Vaping Use   Vaping Use: Never used  Substance and Sexual Activity   Alcohol use: Not Currently   Drug use: Never   Sexual activity: Not on file

## 2022-03-24 ENCOUNTER — Encounter: Payer: Self-pay | Admitting: Orthopaedic Surgery

## 2022-03-24 ENCOUNTER — Ambulatory Visit (INDEPENDENT_AMBULATORY_CARE_PROVIDER_SITE_OTHER): Payer: Medicare Other

## 2022-03-24 ENCOUNTER — Ambulatory Visit (INDEPENDENT_AMBULATORY_CARE_PROVIDER_SITE_OTHER): Payer: Medicare Other | Admitting: Orthopaedic Surgery

## 2022-03-24 ENCOUNTER — Ambulatory Visit: Payer: Medicare Other

## 2022-03-24 DIAGNOSIS — M1712 Unilateral primary osteoarthritis, left knee: Secondary | ICD-10-CM

## 2022-03-24 DIAGNOSIS — G8929 Other chronic pain: Secondary | ICD-10-CM

## 2022-03-24 DIAGNOSIS — M25561 Pain in right knee: Secondary | ICD-10-CM

## 2022-03-24 DIAGNOSIS — Z96641 Presence of right artificial hip joint: Secondary | ICD-10-CM | POA: Diagnosis not present

## 2022-03-24 DIAGNOSIS — M25562 Pain in left knee: Secondary | ICD-10-CM

## 2022-03-24 DIAGNOSIS — M1711 Unilateral primary osteoarthritis, right knee: Secondary | ICD-10-CM | POA: Diagnosis not present

## 2022-03-24 MED ORDER — METHYLPREDNISOLONE ACETATE 40 MG/ML IJ SUSP
40.0000 mg | INTRAMUSCULAR | Status: AC | PRN
  Administered 2022-03-24: 40 mg via INTRA_ARTICULAR

## 2022-03-24 MED ORDER — LIDOCAINE HCL 1 % IJ SOLN
3.0000 mL | INTRAMUSCULAR | Status: AC | PRN
  Administered 2022-03-24: 3 mL

## 2022-03-24 NOTE — Progress Notes (Signed)
? ?Office Visit Note ?  ?Patient: Audrey Gomez [place]           ?Date of Birth: June 08, 1931           ?MRN: [id number] ?Visit Date: 03/24/2022 ?             ?Requested by: Vonita Moss, MD ?[address], [place] ?[place] [address] ?PCP: Vonita Moss, MD ? ? ?Assessment & Plan: ?Visit Diagnoses:  ?1. Status post total replacement of right hip   ?2. Chronic pain of right knee   ?3. Chronic pain of left knee   ?4. Unilateral primary osteoarthritis, right knee   ?5. Unilateral primary osteoarthritis, left knee   ? ? ?Plan: I did place a steroid injection per the patient's request in both knees.  We talked about the possibility at some point of knee replacement surgery.  From a hip standpoint, follow-up is as needed.  We will be the same with her knees.  She knows that she has to wait least 3 months between injections.  I did talk about the possibility of knee replacement surgery in the future if she gets to where it is detrimentally affecting her mobility and her quality of life. ? ?Follow-Up Instructions: Return if symptoms worsen or fail to improve.  ? ?Orders:  ?Orders Placed This Encounter  ?Procedures  ? Large Joint Inj  ? Large Joint Inj  ? XR Pelvis 1-2 Views  ? XR Knee 1-2 Views Right  ? XR Knee 1-2 Views Left  ? ?No orders of the defined types were placed in this encounter. ? ? ? ? Procedures: ?Large Joint Inj: R knee on 03/24/2022 1:28 PM ?Indications: diagnostic evaluation and pain ?Details: 22 G 1.5 in needle, superolateral approach ? ?Arthrogram: No ? ?Medications: 3 mL lidocaine 1 %; 40 mg methylPREDNISolone acetate 40 MG/ML ?Outcome: tolerated well, no immediate complications ?Procedure, treatment alternatives, risks and benefits explained, specific risks discussed. Consent was given by the patient. Immediately prior to procedure a time out was called to verify the correct patient, procedure, equipment, support staff and site/side marked as required. Patient was prepped and draped in the usual  sterile fashion.  ? ? ?Large Joint Inj: L knee on 03/24/2022 1:28 PM ?Indications: diagnostic evaluation and pain ?Details: 22 G 1.5 in needle, superolateral approach ? ?Arthrogram: No ? ?Medications: 3 mL lidocaine 1 %; 40 mg methylPREDNISolone acetate 40 MG/ML ?Outcome: tolerated well, no immediate complications ?Procedure, treatment alternatives, risks and benefits explained, specific risks discussed. Consent was given by the patient. Immediately prior to procedure a time out was called to verify the correct patient, procedure, equipment, support staff and site/side marked as required. Patient was prepped and draped in the usual sterile fashion.  ? ? ? ? ?Clinical Data: ?No additional findings. ? ? ?Subjective: ?Chief Complaint  ?Patient presents with  ? Right Hip - Follow-up  ? Left Knee - Follow-up  ? Right Knee - Follow-up  ?The patient is now 6 months status post a right total hip arthroplasty.  She is 88 years old.  Her bigger problem has both her knees have severe arthritis.  Those are bothering her quite a bit.  I did place steroid injections 3 months ago in both knees.  She is not sure that they helped much but is requesting steroid injections in both knees today.  She says her right total hip arthroplasty is doing well other than some numbness around the incision.  She walks without  assistive device.  Even when she walks she can see her knees are in valgus malalignment. ? ?HPI ? ?Review of Systems ?There is currently listed no fever, chills, nausea, vomiting ? ?Objective: ?Vital Signs: There were no vitals taken for this visit. ? ?Physical Exam ?She is alert and orient x3 and in no acute distress ?Ortho Exam ?Examination of both knees shows valgus malalignment with patellofemoral rotation and global tenderness of the arc of motion.  Examination of her right operative hip shows that moves smoothly and fluidly without any issues at all. ?Specialty Comments:  ?No specialty comments available. ? ?Imaging: ?XR  Knee 1-2 Views Left ? ?Result Date: 03/24/2022 ?2 views of the left knee show end-stage arthritis with osteophytes in all 3 compartments and significant bone-on-bone wear of the lateral compartment and patellofemoral joint with valgus malalignment. ? ?XR Knee 1-2 Views Right ? ?Result Date: 03/24/2022 ?2 views of the right knee show significant tricompartment arthritis with complete loss of joint space of the lateral and patellofemoral compartments.  There are osteophytes in all 3 compartments and valgus malalignment.  The lateral and patellofemoral compartments are bone-on-bone wear. ? ?XR Pelvis 1-2 Views ? ?Result Date: 03/24/2022 ?An AP pelvis shows a right total hip arthroplasty is well-seated with no complicating features.  The left hip joint is well-maintained.  ? ? ?PMFS History: ?Patient Active Problem List  ? Diagnosis Date Noted  ? Unilateral primary osteoarthritis, right knee 03/24/2022  ? Unilateral primary osteoarthritis, left knee 03/24/2022  ? Unilateral primary osteoarthritis, right hip 10/15/2021  ? Status post total replacement of right hip 10/15/2021  ? ?Past Medical History:  ?Diagnosis Date  ? Arthritis   ? GERD (gastroesophageal reflux disease)   ? Hypertension   ? Presence of permanent cardiac pacemaker   ?  ?History reviewed. No pertinent family history.  ?Past Surgical History:  ?Procedure Laterality Date  ? ABDOMINAL HYSTERECTOMY    ? TOTAL HIP ARTHROPLASTY Right 10/15/2021  ? Procedure: RIGHT TOTAL HIP ARTHROPLASTY ANTERIOR APPROACH;  Surgeon: Mcarthur Rossetti, MD;  Location: [facility];  Service: Orthopedics;  Laterality: Right;  ? ?Social History  ? ?Occupational History  ? Not on file  ?Tobacco Use  ? Smoking status: Never  ? Smokeless tobacco: Never  ?Vaping Use  ? Vaping Use: Never used  ?Substance and Sexual Activity  ? Alcohol use: Not Currently  ? Drug use: Never  ? Sexual activity: Not on file  ? ? ? ? ? ? ?

## 2022-03-26 ENCOUNTER — Ambulatory Visit: Payer: Medicare Other | Admitting: Orthopaedic Surgery

## 2022-05-19 ENCOUNTER — Ambulatory Visit (INDEPENDENT_AMBULATORY_CARE_PROVIDER_SITE_OTHER): Payer: Medicare Other | Admitting: Physician Assistant

## 2022-05-19 ENCOUNTER — Ambulatory Visit (INDEPENDENT_AMBULATORY_CARE_PROVIDER_SITE_OTHER): Payer: Medicare Other

## 2022-05-19 ENCOUNTER — Ambulatory Visit: Payer: Self-pay

## 2022-05-19 ENCOUNTER — Encounter: Payer: Self-pay | Admitting: Physician Assistant

## 2022-05-19 DIAGNOSIS — Z96641 Presence of right artificial hip joint: Secondary | ICD-10-CM

## 2022-05-19 DIAGNOSIS — G8929 Other chronic pain: Secondary | ICD-10-CM

## 2022-05-19 DIAGNOSIS — M25562 Pain in left knee: Secondary | ICD-10-CM

## 2022-05-19 DIAGNOSIS — M25561 Pain in right knee: Secondary | ICD-10-CM | POA: Diagnosis not present

## 2022-05-19 NOTE — Progress Notes (Signed)
Office Visit Note   Patient: Audrey Gomez           Date of Birth: [birth year]           MRN: [id number] Visit Date: 05/19/2022              Requested by: Ignacia Palma, MD [address], [address] [place],  [place] [address] PCP: Ignacia Palma, MD   Assessment & Plan: Visit Diagnoses:  1. Status post total replacement of right hip   2. Chronic pain of right knee   3. Chronic pain of left knee     Plan: We will try to gain approval for bilateral knee supplemental injections and have her back once these are available.  She tolerated the aspiration of the right knee today total of 27 cc of bloody synovial fluid was obtained patient tolerated well.  Ace wrap was applied she will remove this before going to bed this evening.  Follow-Up Instructions: Return for Supplemental injection.   Orders:  Orders Placed This Encounter  Procedures   XR Pelvis 1-2 Views   XR Knee 1-2 Views Right   XR Knee 1-2 Views Left   No orders of the defined types were placed in this encounter.     Procedures: No procedures performed   Clinical Data: No additional findings.   Subjective: Chief Complaint  Patient presents with   Right Knee - Follow-up, Pain   Left Knee - Pain    HPI Audrey Gomez is a 88 year old female well-known to Dr. Raye Sorrow service comes in today due to bilateral knee pain.  She was last seen on 03/24/2022 and both knees were injected with cortisone.  She states the injection made her pain worse.  She likes supplemental injections ordered for both knees.  She recently was on vacation and travel by bus to [facility] had bilateral leg swelling and the swelling has gone down since then.  But she has been having difficulty ambulating.  She also did fall while on vacation and had some concerns about her total hip on the right.  Review of Systems See HPI  Objective: Vital Signs: There were no vitals taken for this visit.  Physical Exam Constitutional:       Appearance: She is normal weight.  Pulmonary:     Effort: Pulmonary effort is normal.  Neurological:     Mental Status: She is alert and oriented to person, place, and time.     Ortho Exam Bilateral knees good range of motion of both knees.  No instability valgus varus stressing of either knee.  Right knee slight effusion.  Left knee no effusion.  No abnormal warmth erythema of either knee.  Patellofemoral crepitus with passive range of motion both knees.  Good range of motion hips without pain. Specialty Comments:  No specialty comments available.  Imaging: XR Pelvis 1-2 Views  Result Date: 05/19/2022 AP pelvis: Bilateral hips well located.  Status post right total hip arthroplasty with well-seated components.  Left hip appears well-preserved.  No acute fractures or acute findings.  XR Knee 1-2 Views Right  Result Date: 05/19/2022 Right knee 2 views: No acute fracture.  Knee is well located.  Bone-on-bone lateral compartment moderate narrowing medial compartment with moderate to severe patellofemoral arthritic changes.  Bone appears osteopenic.  XR Knee 1-2 Views Left  Result Date: 05/19/2022 Left knee 2 views: No acute fracture.  Knee is well located.  Near bone-on-bone lateral compartment moderate narrowing medial joint  line and moderate to severe patellofemoral arthritic changes.  Bone appears osteopenic.    PMFS History: Patient Active Problem List   Diagnosis Date Noted   Unilateral primary osteoarthritis, right knee 03/24/2022   Unilateral primary osteoarthritis, left knee 03/24/2022   Unilateral primary osteoarthritis, right hip 10/15/2021   Status post total replacement of right hip 10/15/2021   Past Medical History:  Diagnosis Date   Arthritis    GERD (gastroesophageal reflux disease)    Hypertension    Presence of permanent cardiac pacemaker     History reviewed. No pertinent family history.  Past Surgical History:  Procedure Laterality Date   ABDOMINAL  HYSTERECTOMY     TOTAL HIP ARTHROPLASTY Right 10/15/2021   Procedure: RIGHT TOTAL HIP ARTHROPLASTY ANTERIOR APPROACH;  Surgeon: Kathryne Hitch, MD;  Location: [facility];  Service: Orthopedics;  Laterality: Right;   Social History   Occupational History   Not on file  Tobacco Use   Smoking status: Never   Smokeless tobacco: Never  Vaping Use   Vaping Use: Never used  Substance and Sexual Activity   Alcohol use: Not Currently   Drug use: Never   Sexual activity: Not on file

## 2022-05-21 ENCOUNTER — Telehealth: Payer: Self-pay

## 2022-05-21 NOTE — Telephone Encounter (Signed)
Noted  

## 2022-05-21 NOTE — Telephone Encounter (Signed)
Please get auth for bilateral knee gel injection 

## 2022-06-10 NOTE — Telephone Encounter (Signed)
Submited for VOB on [email or web address] 

## 2022-06-25 ENCOUNTER — Encounter: Payer: Self-pay | Admitting: Physician Assistant

## 2022-06-25 ENCOUNTER — Ambulatory Visit (INDEPENDENT_AMBULATORY_CARE_PROVIDER_SITE_OTHER): Payer: Medicare Other | Admitting: Physician Assistant

## 2022-06-25 DIAGNOSIS — M1712 Unilateral primary osteoarthritis, left knee: Secondary | ICD-10-CM

## 2022-06-25 DIAGNOSIS — M1711 Unilateral primary osteoarthritis, right knee: Secondary | ICD-10-CM | POA: Diagnosis not present

## 2022-06-25 MED ORDER — METHYLPREDNISOLONE ACETATE 40 MG/ML IJ SUSP
40.0000 mg | INTRAMUSCULAR | Status: AC | PRN
  Administered 2022-06-25: 40 mg via INTRA_ARTICULAR

## 2022-06-25 MED ORDER — LIDOCAINE HCL 1 % IJ SOLN
4.0000 mL | INTRAMUSCULAR | Status: AC | PRN
  Administered 2022-06-25: 4 mL

## 2022-06-25 MED ORDER — LIDOCAINE HCL 1 % IJ SOLN
3.0000 mL | INTRAMUSCULAR | Status: AC | PRN
  Administered 2022-06-25: 3 mL

## 2022-06-25 NOTE — Progress Notes (Signed)
   Procedure Note  Patient: Audrey Gomez             Date of Birth: 1930/12/16           MRN: [id number]             Visit Date: 06/25/2022  HPI: Audrey Gomez comes in today for scheduled supplemental injections both knees.  She has known moderate to severe tricompartmental osteoarthritis of both knees.  She has had no new injury to either knee but states she is having significant pain in both knees.  She is not sure she wants the gel injections wanting something that would give her more immediate relief.  She is nondiabetic.  Denies any fevers or chills.  Review of systems: As HPI otherwise negative  General: Well-developed well-nourished female no acute distress mood and affect appropriate. Psych: Alert and oriented x 3. Bilateral knees: Good range of motion of both knees.  No abnormal warmth erythema of either knee.  Slight effusion right knee only.  No instability valgus varus stressing of either knee.  Patellofemoral crepitus with passive range of motion both knees. Procedures: Visit Diagnoses:  1. Unilateral primary osteoarthritis, right knee   2. Unilateral primary osteoarthritis, left knee     Large Joint Inj: bilateral knee on 06/25/2022 5:59 PM Indications: pain Details: 22 G 1.5 in needle, anterolateral approach  Arthrogram: No  Medications (Right): 4 mL lidocaine 1 %; 40 mg methylPREDNISolone acetate 40 MG/ML Aspirate (Right): 12 mL yellow Medications (Left): 3 mL lidocaine 1 %; 40 mg methylPREDNISolone acetate 40 MG/ML Outcome: tolerated well, no immediate complications Procedure, treatment alternatives, risks and benefits explained, specific risks discussed. Consent was given by the patient. Immediately prior to procedure a time out was called to verify the correct patient, procedure, equipment, support staff and site/side marked as required. Patient was prepped and draped in the usual sterile fashion.     Plan: We will see her back in 2 weeks for gel injections  both knees is planned.  She will remove the Ace bandage that was applied right knee tonight before retiring to bed.  Questions were encouraged and answered

## 2022-07-09 ENCOUNTER — Other Ambulatory Visit: Payer: Self-pay

## 2022-07-09 ENCOUNTER — Encounter: Payer: Self-pay | Admitting: Physician Assistant

## 2022-07-09 ENCOUNTER — Ambulatory Visit (INDEPENDENT_AMBULATORY_CARE_PROVIDER_SITE_OTHER): Payer: Medicare Other | Admitting: Physician Assistant

## 2022-07-09 DIAGNOSIS — M1711 Unilateral primary osteoarthritis, right knee: Secondary | ICD-10-CM | POA: Diagnosis not present

## 2022-07-09 DIAGNOSIS — M1712 Unilateral primary osteoarthritis, left knee: Secondary | ICD-10-CM | POA: Diagnosis not present

## 2022-07-09 MED ORDER — HYALURONAN 88 MG/4ML IX SOSY
88.0000 mg | PREFILLED_SYRINGE | INTRA_ARTICULAR | Status: AC | PRN
  Administered 2022-07-09: 88 mg via INTRA_ARTICULAR

## 2022-07-09 MED ORDER — LIDOCAINE HCL 1 % IJ SOLN
3.0000 mL | INTRAMUSCULAR | Status: AC | PRN
  Administered 2022-07-09: 3 mL

## 2022-07-09 NOTE — Progress Notes (Signed)
   Procedure Note  Patient: Audrey Gomez             Date of Birth: June 26, 1931           MRN: [id number]             Visit Date: 07/09/2022 HPI: Audrey Gomez comes in today for scheduled supplemental injections both knees.  She had cortisone injections both knees on 06/25/2022.  States she is still hurting in both knees.  She has had no new injury to either knee.  She has known tricompartmental arthritis of both knees.  She scheduled for no surgery on either knee in the upcoming 6 months.  Bilateral knees: No abnormal warmth erythema.  Positive effusion both knees.  Good range of motion of both knees.  Patellofemoral crepitus with passive range of motion both knees.  Procedures: Visit Diagnoses:  1. Unilateral primary osteoarthritis, right knee   2. Unilateral primary osteoarthritis, left knee     Large Joint Inj: bilateral knee on 07/09/2022 1:53 PM Indications: pain Details: 22 G 1.5 in needle, anterolateral approach  Arthrogram: No  Medications (Right): 3 mL lidocaine 1 %; 88 mg Hyaluronan 88 MG/4ML Aspirate (Right): 18 mL yellow Medications (Left): 3 mL lidocaine 1 %; 88 mg Hyaluronan 88 MG/4ML Aspirate (Left): 15 mL yellow Outcome: tolerated well, no immediate complications Procedure, treatment alternatives, risks and benefits explained, specific risks discussed. Consent was given by the patient. Immediately prior to procedure a time out was called to verify the correct patient, procedure, equipment, support staff and site/side marked as required. Patient was prepped and draped in the usual sterile fashion.     Plan: She knows to wait at least 6 months between supplemental injections.  Questions were encouraged and answered at length today.  Follow-up with [place] as needed.

## 2022-10-16 ENCOUNTER — Ambulatory Visit (INDEPENDENT_AMBULATORY_CARE_PROVIDER_SITE_OTHER): Payer: Medicare Other | Admitting: Physician Assistant

## 2022-10-16 ENCOUNTER — Telehealth: Payer: Self-pay

## 2022-10-16 ENCOUNTER — Encounter: Payer: Self-pay | Admitting: Physician Assistant

## 2022-10-16 DIAGNOSIS — M1711 Unilateral primary osteoarthritis, right knee: Secondary | ICD-10-CM

## 2022-10-16 DIAGNOSIS — M1712 Unilateral primary osteoarthritis, left knee: Secondary | ICD-10-CM | POA: Diagnosis not present

## 2022-10-16 MED ORDER — LIDOCAINE HCL 1 % IJ SOLN
4.0000 mL | INTRAMUSCULAR | Status: AC | PRN
  Administered 2022-10-16: 4 mL

## 2022-10-16 MED ORDER — METHYLPREDNISOLONE ACETATE 40 MG/ML IJ SUSP
40.0000 mg | INTRAMUSCULAR | Status: AC | PRN
  Administered 2022-10-16: 40 mg via INTRA_ARTICULAR

## 2022-10-16 MED ORDER — LIDOCAINE HCL 1 % IJ SOLN
3.0000 mL | INTRAMUSCULAR | Status: AC | PRN
  Administered 2022-10-16: 3 mL

## 2022-10-16 NOTE — Progress Notes (Signed)
   Procedure Note  Patient: Audrey Gomez             Date of Birth: January 12, 1931           MRN: [id number]             Visit Date: 10/16/2022  HPI: Mrs. Garver returns today with bilateral knee pain.  She was last seen on 823 and was given supplemental injections both knees.  She states that her knee pain returned 3 weeks ago.  However she is having increased pain in the right knee.  She notes swelling of the right knee.  She has had no new injury.  She has known tricompartmental arthritis both knees. Review of systems: No fevers chills. Physical exam: General well-developed well-nourished female no acute distress mood and affect appropriate.  Bilateral knees good range of motion.  Right knee positive effusion no abnormal warmth erythema of either knee.  No ecchymosis of either knee.  Bilateral knees with patellofemoral crepitus with passive range of motion.  No instability of either knee.   Procedures: Visit Diagnoses:  1. Unilateral primary osteoarthritis, right knee   2. Unilateral primary osteoarthritis, left knee     Large Joint Inj: bilateral knee on 10/16/2022 4:55 PM Indications: pain Details: 22 G 1.5 in needle, anterolateral approach  Arthrogram: No  Medications (Right): 4 mL lidocaine 1 %; 40 mg methylPREDNISolone acetate 40 MG/ML Aspirate (Right): 21 mL yellow and blood-tinged Medications (Left): 3 mL lidocaine 1 %; 40 mg methylPREDNISolone acetate 40 MG/ML Outcome: tolerated well, no immediate complications Procedure, treatment alternatives, risks and benefits explained, specific risks discussed. Consent was given by the patient. Immediately prior to procedure a time out was called to verify the correct patient, procedure, equipment, support staff and site/side marked as required. Patient was prepped and draped in the usual sterile fashion.      Plan: She understands wait 3 months between cortisone injections.  She is scheduled for supplemental injections of both  knees in late February.  Tolerated cortisone injections both knees and aspiration of the right knee today.  She will follow-up with [place] as scheduled or sooner if there is any questions concerns.  Ace bandage was applied to the right knee today she will remove this before going to bed this evening.

## 2022-10-16 NOTE — Telephone Encounter (Signed)
Please get auth for bilateral gel injection-gil pt  This is for [employer]

## 2022-10-16 NOTE — Telephone Encounter (Signed)
Will submit in the beginning of February, 2024 for gel injection.   Appointment needs to be after 01/09/2023.

## 2023-01-02 ENCOUNTER — Telehealth: Payer: Self-pay

## 2023-01-02 NOTE — Telephone Encounter (Signed)
VOB submitted for Monovisc, bilateral knee  

## 2023-01-06 ENCOUNTER — Other Ambulatory Visit: Payer: Self-pay

## 2023-01-06 DIAGNOSIS — M1712 Unilateral primary osteoarthritis, left knee: Secondary | ICD-10-CM

## 2023-01-06 DIAGNOSIS — M1711 Unilateral primary osteoarthritis, right knee: Secondary | ICD-10-CM

## 2023-01-08 ENCOUNTER — Telehealth: Payer: Self-pay | Admitting: Physician Assistant

## 2023-01-08 ENCOUNTER — Encounter: Payer: Self-pay | Admitting: Physician Assistant

## 2023-01-08 ENCOUNTER — Ambulatory Visit (INDEPENDENT_AMBULATORY_CARE_PROVIDER_SITE_OTHER): Payer: Medicare Other | Admitting: Physician Assistant

## 2023-01-08 DIAGNOSIS — M1711 Unilateral primary osteoarthritis, right knee: Secondary | ICD-10-CM | POA: Diagnosis not present

## 2023-01-08 DIAGNOSIS — M1712 Unilateral primary osteoarthritis, left knee: Secondary | ICD-10-CM

## 2023-01-08 DIAGNOSIS — M170 Bilateral primary osteoarthritis of knee: Secondary | ICD-10-CM

## 2023-01-08 MED ORDER — HYALURONAN 88 MG/4ML IX SOSY
88.0000 mg | PREFILLED_SYRINGE | INTRA_ARTICULAR | Status: AC | PRN
  Administered 2023-01-08: 88 mg via INTRA_ARTICULAR

## 2023-01-08 MED ORDER — LIDOCAINE HCL 1 % IJ SOLN
3.0000 mL | INTRAMUSCULAR | Status: AC | PRN
  Administered 2023-01-08: 3 mL

## 2023-01-08 NOTE — Telephone Encounter (Signed)
Patient requesting Gel injections bIl knee had Gel injections today

## 2023-01-08 NOTE — Telephone Encounter (Signed)
Next available gel injection would need to be after 07/09/2023 for Monovisc, bilateral knee Will submit End of July, 2024.

## 2023-01-08 NOTE — Progress Notes (Signed)
   Procedure Note  Patient: Audrey Gomez             Date of Birth: 1931/06/28           MRN: [id number]             Visit Date: 01/08/2023 HPI: Audrey Gomez returns today for scheduled Monovisc injections both knees.  She has had no new injury to either knee.  She feels she has fluid on both knees.  No known osteoarthritis of both knees.  She has been alternating between cortisone and viscosupplementation injections and this has been working well for her.  She has no scheduled surgery on either knee in the next 6 months.  Physical exam: Bilateral knees no abnormal warmth erythema.  Bilateral knees with positive effusion.  No instability valgus varus stressing of either knee.  Good range of motion of both knees.   Procedures: Visit Diagnoses:  1. Unilateral primary osteoarthritis, right knee   2. Unilateral primary osteoarthritis, left knee     Large Joint Inj: bilateral knee on 01/08/2023 1:13 PM Indications: pain Details: 22 G 1.5 in needle, anterolateral approach  Arthrogram: No  Medications (Right): 3 mL lidocaine 1 %; 88 mg Hyaluronan 88 MG/4ML Aspirate (Right): 26 mL yellow and blood-tinged Medications (Left): 3 mL lidocaine 1 %; 88 mg Hyaluronan 88 MG/4ML Aspirate (Left): 13 mL yellow Outcome: tolerated well, no immediate complications Procedure, treatment alternatives, risks and benefits explained, specific risks discussed. Consent was given by the patient. Immediately prior to procedure a time out was called to verify the correct patient, procedure, equipment, support staff and site/side marked as required. Patient was prepped and draped in the usual sterile fashion.   Plan: She knows to wait 6 months between supplemental injections and 3 months between cortisone injections.  She will follow-up with [place] as needed.  Questions were encouraged and answered.

## 2023-04-09 ENCOUNTER — Ambulatory Visit (INDEPENDENT_AMBULATORY_CARE_PROVIDER_SITE_OTHER): Payer: Medicare Other | Admitting: Physician Assistant

## 2023-04-09 ENCOUNTER — Encounter: Payer: Self-pay | Admitting: Physician Assistant

## 2023-04-09 DIAGNOSIS — M1711 Unilateral primary osteoarthritis, right knee: Secondary | ICD-10-CM

## 2023-04-09 DIAGNOSIS — M1712 Unilateral primary osteoarthritis, left knee: Secondary | ICD-10-CM

## 2023-04-09 MED ORDER — METHYLPREDNISOLONE ACETATE 40 MG/ML IJ SUSP
40.0000 mg | INTRAMUSCULAR | Status: AC | PRN
Start: 2023-04-09 — End: 2023-04-09
  Administered 2023-04-09: 40 mg via INTRA_ARTICULAR

## 2023-04-09 MED ORDER — LIDOCAINE HCL 1 % IJ SOLN
5.0000 mL | INTRAMUSCULAR | Status: AC | PRN
Start: 2023-04-09 — End: 2023-04-09
  Administered 2023-04-09: 5 mL

## 2023-04-09 NOTE — Progress Notes (Signed)
   Procedure Note  Patient: Audrey Gomez             Date of Birth: 08-Sep-1931           MRN: [id number]             Visit Date: 04/09/2023 HPI: Mrs. Audrey Gomez comes in today for injections both knees.  She last had gel injections in her knees on 01/08/2023.  States that these did not work as well as the injections have worked in the past.  She has had no falls no injuries no fevers chills.  She does feel both knees are swollen.  She someone has known tricompartmental osteoarthritis of both knees.  She is really wanting viscosupplementation injections today but it is too early.  Therefore she would like cortisone injections both knees.  She has no upcoming surgery on either knee in the next 6 months.  Physical exam: General well-developed well-nourished female who ambulates without any assistive device. Bilateral knees: Good range of motion of both knees.  No abnormal warmth erythema.  Positive effusion both knees.  Procedures: Visit Diagnoses:  1. Unilateral primary osteoarthritis, right knee   2. Unilateral primary osteoarthritis, left knee     Large Joint Inj: bilateral knee on 04/09/2023 1:06 PM Indications: pain Details: 22 G 1.5 in needle, anterolateral approach  Arthrogram: No  Medications (Right): 5 mL lidocaine 1 %; 40 mg methylPREDNISolone acetate 40 MG/ML Aspirate (Right): 25 mL yellow Medications (Left): 5 mL lidocaine 1 %; 40 mg methylPREDNISolone acetate 40 MG/ML Aspirate (Left): 23 mL yellow Outcome: tolerated well, no immediate complications Procedure, treatment alternatives, risks and benefits explained, specific risks discussed. Consent was given by the patient. Immediately prior to procedure a time out was called to verify the correct patient, procedure, equipment, support staff and site/side marked as required. Patient was prepped and draped in the usual sterile fashion.     Plan: Will try to gain approval for bilateral viscosupplementation injections. She has  tricompartmental arthritis no scheduled surgery in the next 6 months on either knee.  She is not getting good relief with the cortisone injections.  Questions were encouraged and answered

## 2023-05-19 ENCOUNTER — Encounter: Payer: Self-pay | Admitting: Physician Assistant

## 2023-05-19 ENCOUNTER — Ambulatory Visit (INDEPENDENT_AMBULATORY_CARE_PROVIDER_SITE_OTHER): Payer: Medicare Other | Admitting: Physician Assistant

## 2023-05-19 DIAGNOSIS — M1711 Unilateral primary osteoarthritis, right knee: Secondary | ICD-10-CM

## 2023-05-19 DIAGNOSIS — M1712 Unilateral primary osteoarthritis, left knee: Secondary | ICD-10-CM

## 2023-05-19 DIAGNOSIS — M170 Bilateral primary osteoarthritis of knee: Secondary | ICD-10-CM | POA: Diagnosis not present

## 2023-05-19 MED ORDER — LIDOCAINE HCL 1 % IJ SOLN
3.0000 mL | INTRAMUSCULAR | Status: AC | PRN
Start: 2023-05-19 — End: 2023-05-19
  Administered 2023-05-19: 3 mL

## 2023-05-19 NOTE — Progress Notes (Signed)
Office Visit Note   Patient: Audrey Gomez           Date of Birth: May 18, 1931           MRN: [id number] Visit Date: 05/19/2023              Requested by: Ignacia Palma, MD [address], [address] [place],  [place] [address] PCP: Ignacia Palma, MD  Chief Complaint  Patient presents with   Right Knee - Pain   Left Knee - Pain      HPI: Patient is a pleasant 89 year old woman who is accompanied by her daughter.  She is a patient of Kriste Basque.  She periodically comes in to have injections and aspirations of her knees.  She has advanced tricompartmental changes.  She has had no new injury no fever or chills.  She just says "my knees hurt "she understands she is not able to get steroid injections today and she already has planned another appointment with Gill.  She would like to try to get the fluid aspirated  Assessment & Plan: Visit Diagnoses: Osteoarthritis bilateral knees  Plan: Patient already has a planned follow-up with Kriste Basque in mid August for steroid injections.  May follow-up with me as needed  Follow-Up Instructions: Return in about 6 weeks (around 06/30/2023).   Ortho Exam  Patient is alert, oriented, no adenopathy, well-dressed, normal affect, normal respiratory effort. Bilateral knees she does have mild effusions but no warmth no erythema compartments are soft and compressible has very limited patellar mobility she is neurovascular intact  Imaging: No results found. No images are attached to the encounter.  Labs: No results found for: "HGBA1C", "ESRSEDRATE", "CRP", "LABURIC", "REPTSTATUS", "GRAMSTAIN", "CULT", "LABORGA"   No results found for: "ALBUMIN", "PREALBUMIN", "CBC"  No results found for: "MG" No results found for: "VD25OH"  No results found for: "PREALBUMIN"    Latest Ref Rng & Units 10/16/2021    2:56 AM 10/08/2021   11:23 AM  CBC EXTENDED  WBC 4.0 - 10.5 K/uL 7.9  7.6   RBC 3.87 - 5.11 MIL/uL 3.78  4.66   Hemoglobin 12.0 - 15.0  g/dL 19.1  47.8   HCT 29.5 - 46.0 % 35.9  44.5   Platelets 150 - 400 K/uL 166  217      There is no height or weight on file to calculate BMI.  Orders:  Orders Placed This Encounter  Procedures   Large Joint Inj: bilateral knee   No orders of the defined types were placed in this encounter.    Procedures: Large Joint Inj: bilateral knee on 05/19/2023 1:32 PM Indications: pain and diagnostic evaluation Details: 25 G 1.5 in needle, superolateral approach  Arthrogram: No  Medications (Right): 3 mL lidocaine 1 % Medications (Left): 3 mL lidocaine 1 % Outcome: tolerated well, no immediate complications  After obtaining verbal consent bilaterally the superior lateral pouch of both knees was prepped with Betadine and alcohol.  3 cc of lidocaine plain was injected into each knee.  At adequate analgesia an 8 18-gauge needle was inserted.  On the right side 22 cc of serous fluid was aspirated.  On the left side 24 was aspirated.  Band-Aid was applied and Ace wrap's were applied she self ambulated from the clinic Procedure, treatment alternatives, risks and benefits explained, specific risks discussed. Consent was given by the patient.     Clinical Data: No additional findings.  ROS:  All other systems negative, except as  noted in the HPI. Review of Systems  Objective: Vital Signs: There were no vitals taken for this visit.  Specialty Comments:  No specialty comments available.  PMFS History: Patient Active Problem List   Diagnosis Date Noted   Unilateral primary osteoarthritis, right knee 03/24/2022   Unilateral primary osteoarthritis, left knee 03/24/2022   Unilateral primary osteoarthritis, right hip 10/15/2021   Status post total replacement of right hip 10/15/2021   Past Medical History:  Diagnosis Date   Arthritis    GERD (gastroesophageal reflux disease)    Hypertension    Presence of permanent cardiac pacemaker     No family history on file.  Past Surgical  History:  Procedure Laterality Date   ABDOMINAL HYSTERECTOMY     TOTAL HIP ARTHROPLASTY Right 10/15/2021   Procedure: RIGHT TOTAL HIP ARTHROPLASTY ANTERIOR APPROACH;  Surgeon: Kathryne Hitch, MD;  Location: [facility] OR;  Service: Orthopedics;  Laterality: Right;   Social History   Occupational History   Not on file  Tobacco Use   Smoking status: Never   Smokeless tobacco: Never  Vaping Use   Vaping Use: Never used  Substance and Sexual Activity   Alcohol use: Not Currently   Drug use: Never   Sexual activity: Not on file

## 2023-06-26 ENCOUNTER — Telehealth: Payer: Self-pay

## 2023-06-26 NOTE — Telephone Encounter (Signed)
VOB submitted for Monovisc, bilateral knee  

## 2023-07-08 ENCOUNTER — Other Ambulatory Visit: Payer: Self-pay

## 2023-07-08 DIAGNOSIS — M1712 Unilateral primary osteoarthritis, left knee: Secondary | ICD-10-CM

## 2023-07-08 DIAGNOSIS — M1711 Unilateral primary osteoarthritis, right knee: Secondary | ICD-10-CM

## 2023-07-13 ENCOUNTER — Ambulatory Visit (INDEPENDENT_AMBULATORY_CARE_PROVIDER_SITE_OTHER): Payer: Medicare Other | Admitting: Physician Assistant

## 2023-07-13 DIAGNOSIS — M170 Bilateral primary osteoarthritis of knee: Secondary | ICD-10-CM | POA: Diagnosis not present

## 2023-07-13 DIAGNOSIS — M1712 Unilateral primary osteoarthritis, left knee: Secondary | ICD-10-CM

## 2023-07-13 DIAGNOSIS — M1711 Unilateral primary osteoarthritis, right knee: Secondary | ICD-10-CM

## 2023-07-13 MED ORDER — LIDOCAINE HCL 1 % IJ SOLN
3.0000 mL | INTRAMUSCULAR | Status: AC | PRN
Start: 2023-07-13 — End: 2023-07-13
  Administered 2023-07-13: 3 mL

## 2023-07-13 MED ORDER — HYALURONAN 88 MG/4ML IX SOSY
88.0000 mg | PREFILLED_SYRINGE | INTRA_ARTICULAR | Status: AC | PRN
Start: 2023-07-13 — End: 2023-07-13
  Administered 2023-07-13: 88 mg via INTRA_ARTICULAR

## 2023-07-13 NOTE — Progress Notes (Signed)
   Procedure Note  Patient: Audrey Gomez             Date of Birth: Oct 21, 1931           MRN: [id number]             Visit Date: 07/13/2023  HPI:  Audrey Gomez comes in today for scheduled Monovisc injections both knees.  She notes that both knees are swollen.  She like them aspirated also today.  She has had no known injuries.  She has known osteoarthritis both knees.  Has no scheduled surgery on either knee in the next 6 months.  Physical exam: General Well-developed well-nourished female no acute distress.  No assistive device to ambulate. Bilateral knees: No abnormal warmth erythema.  Bilateral knees with positive effusion.  Good range of motion of both knees.   Procedures: Visit Diagnoses:  1. Unilateral primary osteoarthritis, left knee   2. Unilateral primary osteoarthritis, right knee     Large Joint Inj: bilateral knee on 07/13/2023 2:00 PM Indications: pain Details: 22 G 1.5 in needle, anterolateral approach  Arthrogram: No  Medications (Right): 3 mL lidocaine 1 %; 88 mg Hyaluronan 88 MG/4ML Aspirate (Right): 23 mL yellow Medications (Left): 3 mL lidocaine 1 %; 88 mg Hyaluronan 88 MG/4ML Aspirate (Left): 25 mL yellow Outcome: tolerated well, no immediate complications Procedure, treatment alternatives, risks and benefits explained, specific risks discussed. Consent was given by the patient. Immediately prior to procedure a time out was called to verify the correct patient, procedure, equipment, support staff and site/side marked as required. Patient was prepped and draped in the usual sterile fashion.    Plan: Patient tolerated aspiration injection both knees well today.  She will follow-up as needed she knows to wait at least 6 months between supplemental injections.  Questions were encouraged and answered.

## 2023-09-26 IMAGING — DX DG PORTABLE PELVIS
1 series · 1 of 1 positions shown · non-contrast
Comparison: Fluoroscopic images of same day.

CLINICAL DATA: Status post right total hip arthroplasty.

EXAM:
PORTABLE PELVIS 1-2 VIEWS

[pelvis]
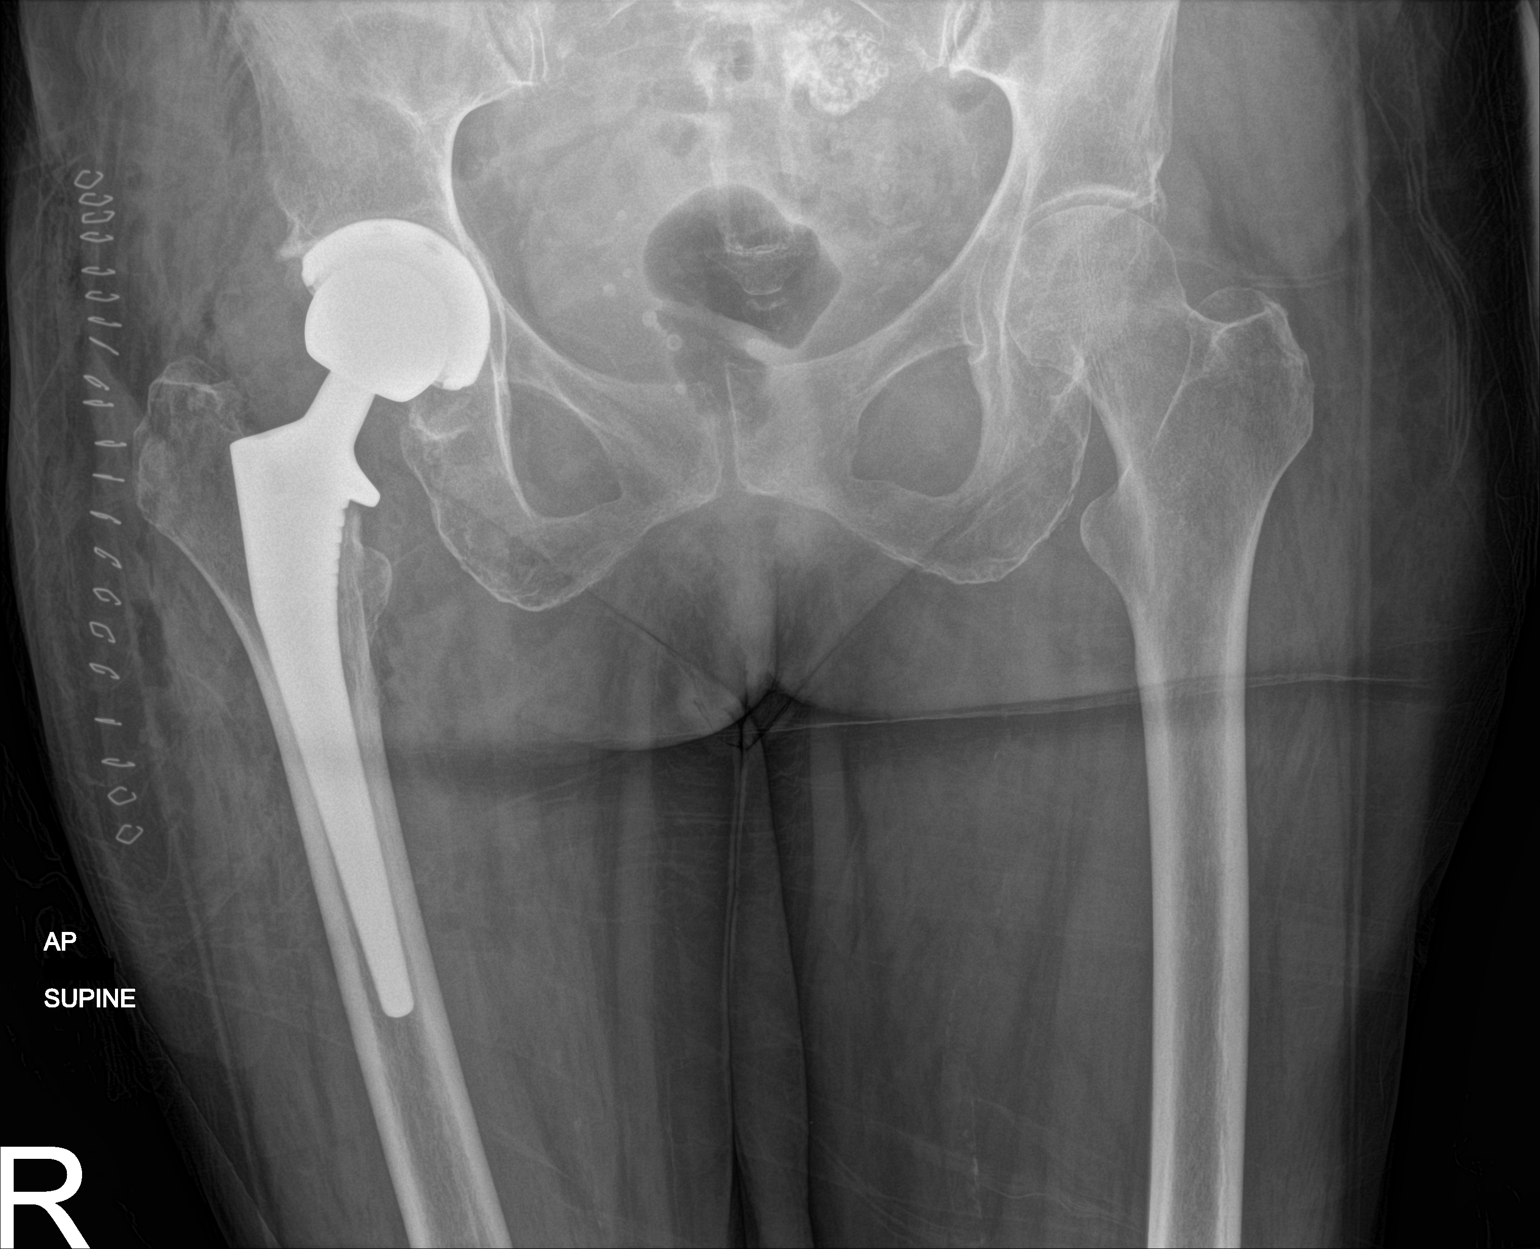

[1 of 1 positions shown; findings below may reference images not displayed]

FINDINGS: Right femoral and acetabular components are well situated. Expected
postoperative changes are seen in the surrounding soft tissues.
IMPRESSION: Status post right total hip arthroplasty.

## 2023-10-12 ENCOUNTER — Encounter: Payer: Self-pay | Admitting: Physician Assistant

## 2023-10-12 ENCOUNTER — Ambulatory Visit (INDEPENDENT_AMBULATORY_CARE_PROVIDER_SITE_OTHER): Payer: Medicare Other | Admitting: Physician Assistant

## 2023-10-12 DIAGNOSIS — M1712 Unilateral primary osteoarthritis, left knee: Secondary | ICD-10-CM

## 2023-10-12 DIAGNOSIS — I83813 Varicose veins of bilateral lower extremities with pain: Secondary | ICD-10-CM

## 2023-10-12 DIAGNOSIS — M1711 Unilateral primary osteoarthritis, right knee: Secondary | ICD-10-CM | POA: Diagnosis not present

## 2023-10-12 MED ORDER — LIDOCAINE HCL 1 % IJ SOLN
4.0000 mL | INTRAMUSCULAR | Status: AC | PRN
  Administered 2023-10-12: 4 mL

## 2023-10-12 MED ORDER — METHYLPREDNISOLONE ACETATE 40 MG/ML IJ SUSP
40.0000 mg | INTRAMUSCULAR | Status: AC | PRN
  Administered 2023-10-12: 40 mg via INTRA_ARTICULAR

## 2023-10-12 NOTE — Progress Notes (Signed)
          Procedure Note  Patient: Audrey Gomez             Date of Birth: March 15, 1931           MRN: [id number]             Visit Date: 10/12/2023   HPI: Audrey Gomez 89 year old female well-known to Dr. Raye Sorrow service comes in today requesting bilateral knee injections.  She had Monovisc injections both knees July 13, 2023.  States the injections helped.  She also feels she may have swelling particularly the right knee.  No new injury.  She has known osteoarthritis of both knees.  She also is having bilateral lower leg pain.  Review of systems: Negative for chills.  Physical exam: General: Well-developed well-nourished female is able to get on and off the exam table on her own.  No assistive device. Bilateral knees: No abnormal warmth erythema.  Positive effusion both knees.  Good range of motion of both knees.  Bilateral lower extremities no rashes skin lesions ulcerations varicose veins throughout both lower legs.  Procedures: Visit Diagnoses:  1. Varicose veins of both lower extremities with pain   2. Unilateral primary osteoarthritis, left knee   3. Unilateral primary osteoarthritis, right knee     Large Joint Inj: bilateral knee on 10/12/2023 1:29 PM Indications: pain Details: 22 G 1.5 in needle, superolateral approach  Arthrogram: No  Medications (Right): 4 mL lidocaine 1 %; 40 mg methylPREDNISolone acetate 40 MG/ML Aspirate (Right): 26 mL Medications (Left): 4 mL lidocaine 1 %; 40 mg methylPREDNISolone acetate 40 MG/ML Aspirate (Left): 22 mL Outcome: tolerated well, no immediate complications Procedure, treatment alternatives, risks and benefits explained, specific risks discussed. Consent was given by the patient. Immediately prior to procedure a time out was called to verify the correct patient, procedure, equipment, support staff and site/side marked as required. Patient was prepped and draped in the usual sterile fashion.    Plan: She knows to wait at  least 6 months between viscosupplementation injections in 3 months between cortisone injections.  She is given a prescription for TED hose which recommend she wear during the day.  Questions were encouraged and answered at length.

## 2023-10-13 ENCOUNTER — Telehealth: Payer: Self-pay | Admitting: Radiology

## 2023-10-13 NOTE — Telephone Encounter (Signed)
Talked with patient and appointment has been scheduled for 01/14/2024 for gel injection

## 2023-10-13 NOTE — Telephone Encounter (Signed)
Patients last gel injection was 07/13/23.  She would like to schedule her next appointment for both knees again, we advised must get approval first and then will be contacted to schedule.  How soon can you submit approval since it hasn't been 6 months yet?

## 2024-01-14 ENCOUNTER — Ambulatory Visit (INDEPENDENT_AMBULATORY_CARE_PROVIDER_SITE_OTHER): Payer: Medicare Other | Admitting: Physician Assistant

## 2024-01-14 DIAGNOSIS — M1711 Unilateral primary osteoarthritis, right knee: Secondary | ICD-10-CM | POA: Diagnosis not present

## 2024-01-14 DIAGNOSIS — M1712 Unilateral primary osteoarthritis, left knee: Secondary | ICD-10-CM

## 2024-01-14 MED ORDER — LIDOCAINE HCL 1 % IJ SOLN
4.0000 mL | INTRAMUSCULAR | Status: AC | PRN
  Administered 2024-01-14: 4 mL

## 2024-01-14 MED ORDER — HYALURONAN 88 MG/4ML IX SOSY
88.0000 mg | PREFILLED_SYRINGE | INTRA_ARTICULAR | Status: AC | PRN
  Administered 2024-01-14: 88 mg via INTRA_ARTICULAR

## 2024-01-14 NOTE — Progress Notes (Signed)
   Procedure Note  Patient: Audrey Gomez             Date of Birth: 05/12/1931           MRN: [id number]             Visit Date: 01/14/2024 Audrey Gomez comes in for scheduled visco supplementation injections both knees. No known injury to either knee. Known osteoarthritis both knees. Failed conservative treatment . No scheduled knee surgery in next 6 months.  Notes that last viscosupplementation injections gave her good relief for several months.  PE: Bilateral knees: No abnormal warmth or erythema.  Positive effusion both knees.  Overall good range of motion of both knees.  Procedures: Visit Diagnoses:  1. Unilateral primary osteoarthritis, left knee   2. Unilateral primary osteoarthritis, right knee     Large Joint Inj: bilateral knee on 01/14/2024 5:07 PM Indications: pain Details: 22 G 1.5 in needle, anterolateral approach  Arthrogram: No  Medications (Right): 88 mg Hyaluronan 88 MG/4ML; 4 mL lidocaine 1 % Aspirate (Right): 28 mL yellow Medications (Left): 88 mg Hyaluronan 88 MG/4ML; 4 mL lidocaine 1 % Aspirate (Left): 22 mL yellow Outcome: tolerated well, no immediate complications Procedure, treatment alternatives, risks and benefits explained, specific risks discussed. Consent was given by the patient. Immediately prior to procedure a time out was called to verify the correct patient, procedure, equipment, support staff and site/side marked as required. Patient was prepped and draped in the usual sterile fashion.    Plan: She understands to wait at least 6 months between viscosupplementation injections.

## 2024-04-14 ENCOUNTER — Encounter: Payer: Self-pay | Admitting: Physician Assistant

## 2024-04-14 ENCOUNTER — Ambulatory Visit: Payer: Medicare Other | Admitting: Physician Assistant

## 2024-04-14 DIAGNOSIS — M1712 Unilateral primary osteoarthritis, left knee: Secondary | ICD-10-CM

## 2024-04-14 DIAGNOSIS — M1711 Unilateral primary osteoarthritis, right knee: Secondary | ICD-10-CM | POA: Diagnosis not present

## 2024-04-14 MED ORDER — METHYLPREDNISOLONE ACETATE 40 MG/ML IJ SUSP
40.0000 mg | INTRAMUSCULAR | Status: AC | PRN
  Administered 2024-04-14: 40 mg via INTRA_ARTICULAR

## 2024-04-14 MED ORDER — LIDOCAINE HCL 1 % IJ SOLN
5.0000 mL | INTRAMUSCULAR | Status: AC | PRN
  Administered 2024-04-14: 5 mL

## 2024-04-14 NOTE — Progress Notes (Signed)
   Procedure Note  Patient: Audrey Gomez             Date of Birth: [birth year]           MRN: [id number]             Visit Date: 04/14/2024  HPI: Audrey Gomez comes in today requesting cortisone injections both knees.  She was last seen on 01/14/2024 and giving viscosupplementation injections of both knees.  She states knees were doing well until about 4 weeks ago.  Both knees give way at times.  Right knee worse than her left.  No new injuries.  Review of systems: Negative for fevers chills.  Bilateral knees: Good range of motion of both knees.  Right knee significant patellofemoral crepitus.  Slight effusion both knees.  No abnormal warmth erythema of either knee.  No gross instability bilaterally.   Procedures: Visit Diagnoses:  1. Unilateral primary osteoarthritis, left knee   2. Unilateral primary osteoarthritis, right knee     Large Joint Inj: bilateral knee on 04/14/2024 1:17 PM Indications: pain Details: 22 G 1.5 in needle, anterolateral approach  Arthrogram: No  Medications (Right): 5 mL lidocaine  1 %; 40 mg methylPREDNISolone  acetate 40 MG/ML Aspirate (Right): 25 mL yellow Medications (Left): 5 mL lidocaine  1 %; 40 mg methylPREDNISolone  acetate 40 MG/ML Aspirate (Left): 20 mL yellow Outcome: tolerated well, no immediate complications Procedure, treatment alternatives, risks and benefits explained, specific risks discussed. Consent was given by the patient. Immediately prior to procedure a time out was called to verify the correct patient, procedure, equipment, support staff and site/side marked as required. Patient was prepped and draped in the usual sterile fashion.    Plan: Both knees were wrapped with Ace bandages today.  She will remove these this evening before returning to bed.  Encouraged her to ice her knees tonight for 15 to 20 minutes.  She knows to wait 3 months between cortisone injections and 6 months between viscosupplementation injections.  She will  follow-up as needed.

## 2024-05-12 ENCOUNTER — Encounter: Payer: Self-pay | Admitting: Physician Assistant

## 2024-05-12 ENCOUNTER — Ambulatory Visit (INDEPENDENT_AMBULATORY_CARE_PROVIDER_SITE_OTHER): Payer: Self-pay

## 2024-05-12 ENCOUNTER — Encounter: Payer: Self-pay | Admitting: Sports Medicine

## 2024-05-12 ENCOUNTER — Ambulatory Visit: Admitting: Sports Medicine

## 2024-05-12 ENCOUNTER — Other Ambulatory Visit: Payer: Self-pay

## 2024-05-12 ENCOUNTER — Ambulatory Visit: Admitting: Physician Assistant

## 2024-05-12 DIAGNOSIS — G8929 Other chronic pain: Secondary | ICD-10-CM

## 2024-05-12 DIAGNOSIS — M1712 Unilateral primary osteoarthritis, left knee: Secondary | ICD-10-CM | POA: Diagnosis not present

## 2024-05-12 DIAGNOSIS — M19011 Primary osteoarthritis, right shoulder: Secondary | ICD-10-CM

## 2024-05-12 DIAGNOSIS — M25511 Pain in right shoulder: Secondary | ICD-10-CM

## 2024-05-12 DIAGNOSIS — M1711 Unilateral primary osteoarthritis, right knee: Secondary | ICD-10-CM

## 2024-05-12 MED ORDER — BUPIVACAINE HCL 0.25 % IJ SOLN
2.0000 mL | INTRAMUSCULAR | Status: AC | PRN
  Administered 2024-05-12: 2 mL via INTRA_ARTICULAR

## 2024-05-12 MED ORDER — LIDOCAINE HCL 1 % IJ SOLN
2.0000 mL | INTRAMUSCULAR | Status: AC | PRN
  Administered 2024-05-12: 2 mL

## 2024-05-12 MED ORDER — METHYLPREDNISOLONE ACETATE 40 MG/ML IJ SUSP
40.0000 mg | INTRAMUSCULAR | Status: AC | PRN
Start: 2024-05-12 — End: 2024-05-12
  Administered 2024-05-12: 40 mg via INTRA_ARTICULAR

## 2024-05-12 NOTE — Progress Notes (Signed)
   Procedure Note  Patient: Audrey Gomez             Date of Birth: 08-Feb-1931           MRN: [id number]             Visit Date: 05/12/2024  Procedures: Visit Diagnoses:  1. Chronic right shoulder pain   2. Primary osteoarthritis, right shoulder    Large Joint Inj: R glenohumeral on 05/12/2024 2:18 PM Indications: pain Details: 22 G 3.5 in needle, ultrasound-guided posterior approach Medications: 2 mL lidocaine  1 %; 2 mL bupivacaine  0.25 %; 40 mg methylPREDNISolone  acetate 40 MG/ML Outcome: tolerated well, no immediate complications  US -guided glenohumeral joint injection, Right shoulder After discussion on risks/benefits/indications, informed verbal consent was obtained. A timeout was then performed. The patient was positioned lying lateral recumbent on examination table. The patient's shoulder was prepped with betadine  and multiple alcohol swabs  and utilizing ultrasound guidance, the patient's glenohumeral joint was identified on ultrasound. Using ultrasound guidance a 22-gauge, 3.5 inch needle with a mixture of 2:2:1 cc's lidocaine :bupivicaine:depomedrol was directed from a lateral to medial direction via in-plane technique into the glenohumeral joint with visualization of appropriate spread of injectate into the joint. Patient tolerated the procedure well without immediate complications.      Procedure, treatment alternatives, risks and benefits explained, specific risks discussed. Consent was given by the patient. Immediately prior to procedure a time out was called to verify the correct patient, procedure, equipment, support staff and site/side marked as required. Patient was prepped and draped in the usual sterile fashion.     - patient tolerated procedure well, discussed post-injection protocol - follow-up with Dr. Lorane Gaskins, PA-C as indicated; I am happy to see them as needed  Lonell Sprang, DO Primary Care Sports Medicine Physician  [facility] -  Orthopedics  This note was dictated using Dragon naturally speaking software and may contain errors in syntax, spelling, or content which have not been identified prior to signing this note.

## 2024-05-12 NOTE — Progress Notes (Signed)
 Office Visit Note   Patient: Audrey Gomez           Date of Birth: June 06, 1931           MRN: [id number] Visit Date: 05/12/2024              Requested by: Tobie Eleanore NOVAK, MD [address], [address] [place],  [place] [address] PCP: Tobie Eleanore NOVAK, MD   Assessment & Plan: Visit Diagnoses:  1. Chronic right shoulder pain     Plan: Will send her for an extra-articular injection under ultrasound with Dr. Burnetta for the right shoulder osteoarthritis.  In regards to her knees she knows to wait 3 months between cortisone injections and 6 months between viscosupplementation injections.  Follow-up as needed  Follow-Up Instructions: No follow-ups on file.   Orders:  Orders Placed This Encounter  Procedures   XR Shoulder Right   No orders of the defined types were placed in this encounter.     Procedures: No procedures performed   Clinical Data: No additional findings.   Subjective: Chief Complaint  Patient presents with   Right Shoulder - Pain   Right Knee - Edema   Left Knee - Edema    HPI Mrs. Audrey Gomez comes in today due to right shoulder pain.  She is also wanting both knees aspirated.  She is having pain with any movement of the right shoulder.  No known injury.  Notes decreased range of motion of the shoulder.  No numbness tingling down the arm. Bilateral knees.  She has had cortisone injections and gel injections.  Despite this continues to have pain in both knees.  Has known osteoarthritis both knees.  No known injury.  Review of Systems Negative for fevers  Objective: Vital Signs: There were no vitals taken for this visit.  Physical Exam Constitutional:      Appearance: She is normal weight. She is not ill-appearing or diaphoretic.   Neurological:     Mental Status: She is alert.     Ortho Exam Bilateral knees: No abnormal warmth erythema good range of motion.  Positive effusion bilaterally. Left knee aspirated total of 21 cc of yellow aspirate  obtained.  Right knee aspirated total of 26 cc of yellow aspirated today. Bilateral shoulders: Good range of motion left shoulder.  Right shoulder she has limited forward flexion to only proximal 100 1020 degrees.  External and internal rotation against resistance 5 out of 5 bilaterally.  Empty can test is positive on the right negative on the left.  Liftoff test is negative on the right.  Specialty Comments:  No specialty comments available.  Imaging: XR Shoulder Right Result Date: 05/12/2024 Right shoulder 3 views: Shows basically bone-on-bone arthritis with osteophytes off the glenoid and humeral head.  No acute fractures.  Shoulder is slightly high riding.  No bony abnormalities otherwise.    PMFS History: Patient Active Problem List   Diagnosis Date Noted   Unilateral primary osteoarthritis, right knee 03/24/2022   Unilateral primary osteoarthritis, left knee 03/24/2022   Unilateral primary osteoarthritis, right hip 10/15/2021   Status post total replacement of right hip 10/15/2021   Past Medical History:  Diagnosis Date   Arthritis    GERD (gastroesophageal reflux disease)    Hypertension    Presence of permanent cardiac pacemaker     No family history on file.  Past Surgical History:  Procedure Laterality Date   ABDOMINAL HYSTERECTOMY     TOTAL HIP ARTHROPLASTY Right 10/15/2021  Procedure: RIGHT TOTAL HIP ARTHROPLASTY ANTERIOR APPROACH;  Surgeon: Vernetta Lonni GRADE, MD;  Location: [facility];  Service: Orthopedics;  Laterality: Right;   Social History   Occupational History   Not on file  Tobacco Use   Smoking status: Never   Smokeless tobacco: Never  Vaping Use   Vaping status: Never Used  Substance and Sexual Activity   Alcohol use: Not Currently   Drug use: Never   Sexual activity: Not on file

## 2024-07-11 ENCOUNTER — Ambulatory Visit: Admitting: Physician Assistant

## 2024-07-13 ENCOUNTER — Other Ambulatory Visit: Payer: Self-pay

## 2024-07-13 DIAGNOSIS — M1712 Unilateral primary osteoarthritis, left knee: Secondary | ICD-10-CM

## 2024-07-13 DIAGNOSIS — M1711 Unilateral primary osteoarthritis, right knee: Secondary | ICD-10-CM

## 2024-07-14 ENCOUNTER — Encounter: Payer: Self-pay | Admitting: Physician Assistant

## 2024-07-14 ENCOUNTER — Ambulatory Visit (INDEPENDENT_AMBULATORY_CARE_PROVIDER_SITE_OTHER): Admitting: Physician Assistant

## 2024-07-14 DIAGNOSIS — M1711 Unilateral primary osteoarthritis, right knee: Secondary | ICD-10-CM

## 2024-07-14 DIAGNOSIS — M1712 Unilateral primary osteoarthritis, left knee: Secondary | ICD-10-CM | POA: Diagnosis not present

## 2024-07-14 MED ORDER — HYALURONAN 88 MG/4ML IX SOSY
88.0000 mg | PREFILLED_SYRINGE | INTRA_ARTICULAR | Status: AC | PRN
  Administered 2024-07-14: 88 mg via INTRA_ARTICULAR

## 2024-07-14 MED ORDER — LIDOCAINE HCL 1 % IJ SOLN
3.0000 mL | INTRAMUSCULAR | Status: AC | PRN
  Administered 2024-07-14: 3 mL

## 2024-07-14 NOTE — Progress Notes (Signed)
   Procedure Note  Patient: Audrey Gomez             Date of Birth: [birth year]           MRN: [id number]             Visit Date: 07/14/2024 HPI: Mrs. Woodfin comes in today for scheduled Monovisc injections both knees.  She has known osteoarthritis both knees.  She has no upcoming surgery of either knee in the next 6 months.  She has failed conservative treatment which is included cortisone injections.  She has had multiple aspirations of both knees.  She is had prior viscosupplementation's that are beneficial.  No new injury to either knee. Physical exam: Bilateral knees overall good range of motion.  No abnormal warmth or erythema.  Positive effusion bilaterally.  Procedures: Visit Diagnoses:  1. Unilateral primary osteoarthritis, left knee   2. Unilateral primary osteoarthritis, right knee     Large Joint Inj: bilateral knee on 07/14/2024 5:23 PM Indications: pain Details: 22 G 1.5 in needle, superolateral approach  Arthrogram: No  Medications (Right): 3 mL lidocaine  1 %; 88 mg Hyaluronan 88 MG/4ML Aspirate (Right): 27 mL yellow Medications (Left): 3 mL lidocaine  1 %; 88 mg Hyaluronan 88 MG/4ML Aspirate (Left): 22 mL yellow Outcome: tolerated well, no immediate complications Procedure, treatment alternatives, risks and benefits explained, specific risks discussed. Consent was given by the patient. Immediately prior to procedure a time out was called to verify the correct patient, procedure, equipment, support staff and site/side marked as required. Patient was prepped and draped in the usual sterile fashion.     Plan: She will follow-up with us  as needed she knows lately 6 months between supplemental injections.  Both knees were wrapped with Ace bandages which she will remove this evening before retiring to bed.

## 2024-10-06 ENCOUNTER — Ambulatory Visit: Admitting: Physician Assistant

## 2024-10-10 ENCOUNTER — Ambulatory Visit: Admitting: Physician Assistant

## 2024-10-10 ENCOUNTER — Encounter: Payer: Self-pay | Admitting: Physician Assistant

## 2024-10-10 ENCOUNTER — Other Ambulatory Visit: Payer: Self-pay

## 2024-10-10 ENCOUNTER — Ambulatory Visit (INDEPENDENT_AMBULATORY_CARE_PROVIDER_SITE_OTHER): Admitting: Sports Medicine

## 2024-10-10 ENCOUNTER — Encounter: Payer: Self-pay | Admitting: Sports Medicine

## 2024-10-10 DIAGNOSIS — R202 Paresthesia of skin: Secondary | ICD-10-CM | POA: Diagnosis not present

## 2024-10-10 DIAGNOSIS — M170 Bilateral primary osteoarthritis of knee: Secondary | ICD-10-CM

## 2024-10-10 DIAGNOSIS — M25511 Pain in right shoulder: Secondary | ICD-10-CM | POA: Diagnosis not present

## 2024-10-10 DIAGNOSIS — M2041 Other hammer toe(s) (acquired), right foot: Secondary | ICD-10-CM | POA: Diagnosis not present

## 2024-10-10 DIAGNOSIS — M1712 Unilateral primary osteoarthritis, left knee: Secondary | ICD-10-CM

## 2024-10-10 DIAGNOSIS — M1711 Unilateral primary osteoarthritis, right knee: Secondary | ICD-10-CM

## 2024-10-10 DIAGNOSIS — M19011 Primary osteoarthritis, right shoulder: Secondary | ICD-10-CM

## 2024-10-10 DIAGNOSIS — G8929 Other chronic pain: Secondary | ICD-10-CM

## 2024-10-10 MED ORDER — LIDOCAINE HCL 1 % IJ SOLN
5.0000 mL | INTRAMUSCULAR | Status: AC | PRN
  Administered 2024-10-10: 5 mL

## 2024-10-10 MED ORDER — METHYLPREDNISOLONE ACETATE 40 MG/ML IJ SUSP
40.0000 mg | INTRAMUSCULAR | Status: AC | PRN
  Administered 2024-10-10: 40 mg via INTRA_ARTICULAR

## 2024-10-10 MED ORDER — BUPIVACAINE HCL 0.25 % IJ SOLN
2.0000 mL | INTRAMUSCULAR | Status: AC | PRN
  Administered 2024-10-10: 2 mL via INTRA_ARTICULAR

## 2024-10-10 MED ORDER — LIDOCAINE HCL 1 % IJ SOLN
2.0000 mL | INTRAMUSCULAR | Status: AC | PRN
  Administered 2024-10-10: 2 mL

## 2024-10-10 NOTE — Progress Notes (Signed)
   Procedure Note  Patient: Audrey Gomez             Date of Birth: [birth year]           MRN: [id number]             Visit Date: 10/10/2024  Procedures: Visit Diagnoses:  1. Chronic right shoulder pain    Large Joint Inj: R glenohumeral on 10/10/2024 1:34 PM Indications: pain Details: 22 G 3.5 in needle, ultrasound-guided posterior approach Medications: 2 mL lidocaine  1 %; 2 mL bupivacaine  0.25 %; 40 mg methylPREDNISolone  acetate 40 MG/ML Outcome: tolerated well, no immediate complications  US -guided glenohumeral joint injection, right shoulder After discussion on risks/benefits/indications, informed verbal consent was obtained. A timeout was then performed. The patient was positioned lying lateral recumbent on examination table. The patient's shoulder was prepped with chloraprep and multiple alcohol swabs  and utilizing ultrasound guidance, the patient's glenohumeral joint was identified on ultrasound. Using ultrasound guidance a 22-gauge, 3.5 inch needle with a mixture of 2:2:1 cc's lidocaine :bupivicaine:depomedrol was directed from a lateral to medial direction via in-plane technique into the glenohumeral joint with visualization of appropriate spread of injectate into the joint. Patient tolerated the procedure well without immediate complications.      Procedure, treatment alternatives, risks and benefits explained, specific risks discussed. Consent was given by the patient. Immediately prior to procedure a time out was called to verify the correct patient, procedure, equipment, support staff and site/side marked as required. Patient was prepped and draped in the usual sterile fashion.     - patient tolerated procedure well, discussed post-injection protocol - follow-up with Dr. Lorane Gaskins as indicated; I am happy to see them as needed  Lonell Sprang, DO Primary Care Sports Medicine Physician  [facility] - Orthopedics  This note was dictated using Dragon  naturally speaking software and may contain errors in syntax, spelling, or content which have not been identified prior to signing this note.

## 2024-10-10 NOTE — Progress Notes (Signed)
 HPI: [name] comes in today requesting cortisone injections both knees.  She was last seen on 07/14/2024 and was given bilateral knee gel injections.  She is also asking about her shoulder and if she could have a repeat injection by Dr. Burnetta for her shoulder OA.  This would be an intra-articular injection under ultrasound.  The patient ask about her fourth right toe which she has a scab on.  She states that the scabs been there for some time.  No known injury.  She does use a pad over this area.  Lastly patient asked about some numbness and tingling she is having in her right hand involving the thumb index middle and ring finger.  This been ongoing for some time no injury.  She is wondering if she has carpal tunnel syndrome.  Review systems: See HPI otherwise negative  Physical exam: General Well-developed well-nourished female able to get on and off the exam table on her own. Right hand: No rashes skin lesions or ulcerations.  Radial pulse 2+.  Positive compression test over the median nerve.  Phalen's test is negative.  Normal sensation subjectively to light touch throughout the fingertips. Right foot: Foot is well-perfused.  Right fourth toe hammertoe present with callus formation at the PIP joint.  There is no impending ulcer no drainage. Bilateral knees: Good range of motion both knees.  Slight effusion right knee.  Larger effusion left knee.  No abnormal warmth erythema.  No instability valgus varus stress of either knee.  Impression: Bilateral knee osteoarthritis Right foot hammertoe Right hand paresthesia median nerve distribution Right shoulder OA  Plan: She is given totally that she is to take off daily and inspect her toes to hold the fourth toe down.  She is also to get a deeper toebox shoe and a wider toebox shoe that will help accommodate her toes.  In regards to the numbness tingling in the right hand.  Recommend Voltaren gel at the median nerve at the wrist 2 g 4 times daily.  Dr.  Burnetta was able to accommodate her today and provide an right shoulder intra-articular injection under ultrasound.  We provided her with cortisone injections both knees today and aspirations.      Procedure Note  Patient: Audrey Gomez             Date of Birth: Dec 18, 1930           MRN: [id number]             Visit Date: 10/10/2024  Procedures: Visit Diagnoses:  1. Unilateral primary osteoarthritis, left knee   2. Unilateral primary osteoarthritis, right knee   3. Primary osteoarthritis, right shoulder   4. Paresthesias in right hand   5. Hammertoe of right foot     Large Joint Inj: bilateral knee on 10/10/2024 5:54 PM Indications: pain Details: 22 G 1.5 in needle, superolateral approach  Arthrogram: No  Medications (Right): 5 mL lidocaine  1 %; 40 mg methylPREDNISolone  acetate 40 MG/ML Aspirate (Right): 25 mL yellow Medications (Left): 5 mL lidocaine  1 %; 40 mg methylPREDNISolone  acetate 40 MG/ML Aspirate (Left): 7 mL yellow Outcome: tolerated well, no immediate complications Procedure, treatment alternatives, risks and benefits explained, specific risks discussed. Consent was given by the patient. Immediately prior to procedure a time out was called to verify the correct patient, procedure, equipment, support staff and site/side marked as required. Patient was prepped and draped in the usual sterile fashion.

## 2025-01-12 ENCOUNTER — Ambulatory Visit: Admitting: Physician Assistant
